# Patient Record
Sex: Female | Born: 1955 | Race: White | Hispanic: No | Marital: Married | Smoking: Never smoker
Health system: Southern US, Community
[De-identification: ages and names within clinical notes are randomized; demographics above are authoritative.]

## PROBLEM LIST (undated history)

## (undated) DIAGNOSIS — L0390 Cellulitis, unspecified: Secondary | ICD-10-CM

## (undated) DIAGNOSIS — Z853 Personal history of malignant neoplasm of breast: Secondary | ICD-10-CM

## (undated) DIAGNOSIS — N63 Unspecified lump in unspecified breast: Secondary | ICD-10-CM

## (undated) DIAGNOSIS — L0291 Cutaneous abscess, unspecified: Secondary | ICD-10-CM

## (undated) DIAGNOSIS — G40909 Epilepsy, unspecified, not intractable, without status epilepticus: Secondary | ICD-10-CM

## (undated) DIAGNOSIS — C50919 Malignant neoplasm of unspecified site of unspecified female breast: Secondary | ICD-10-CM

## (undated) DIAGNOSIS — C50419 Malignant neoplasm of upper-outer quadrant of unspecified female breast: Secondary | ICD-10-CM

## (undated) DIAGNOSIS — Z1239 Encounter for other screening for malignant neoplasm of breast: Secondary | ICD-10-CM

## (undated) DIAGNOSIS — Z1211 Encounter for screening for malignant neoplasm of colon: Secondary | ICD-10-CM

## (undated) DIAGNOSIS — T792XXA Traumatic secondary and recurrent hemorrhage and seroma, initial encounter: Secondary | ICD-10-CM

## (undated) HISTORY — DX: Cellulitis, unspecified: L03.90

## (undated) HISTORY — DX: Malignant neoplasm of upper-outer quadrant of unspecified female breast: C50.419

## (undated) HISTORY — PX: BREAST EXCISIONAL BIOPSY: SUR124

## (undated) HISTORY — PX: OTHER SURGICAL HISTORY: SHX169

## (undated) HISTORY — DX: Unspecified lump in unspecified breast: N63.0

## (undated) HISTORY — DX: Epilepsy, unspecified, not intractable, without status epilepticus: G40.909

## (undated) HISTORY — DX: Traumatic secondary and recurrent hemorrhage and seroma, initial encounter: T79.2XXA

## (undated) HISTORY — DX: Encounter for screening for malignant neoplasm of colon: Z12.11

## (undated) HISTORY — DX: Encounter for other screening for malignant neoplasm of breast: Z12.39

## (undated) HISTORY — PX: GANGLION CYST EXCISION: SHX1691

## (undated) HISTORY — PX: WISDOM TOOTH EXTRACTION: SHX21

## (undated) HISTORY — DX: Cutaneous abscess, unspecified: L02.91

## (undated) HISTORY — DX: Personal history of malignant neoplasm of breast: Z85.3

---
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
## 2003-01-30 HISTORY — PX: CHOLECYSTECTOMY: SHX55

## 2003-12-14 ENCOUNTER — Ambulatory Visit: Payer: Self-pay | Admitting: General Surgery

## 2004-01-11 ENCOUNTER — Ambulatory Visit: Payer: Self-pay | Admitting: Obstetrics and Gynecology

## 2005-02-08 ENCOUNTER — Ambulatory Visit: Payer: Self-pay | Admitting: Obstetrics and Gynecology

## 2006-02-28 ENCOUNTER — Ambulatory Visit: Payer: Self-pay | Admitting: Obstetrics and Gynecology

## 2007-04-08 ENCOUNTER — Ambulatory Visit: Payer: Self-pay | Admitting: Obstetrics and Gynecology

## 2008-04-13 ENCOUNTER — Ambulatory Visit: Payer: Self-pay | Admitting: Obstetrics and Gynecology

## 2008-04-26 ENCOUNTER — Ambulatory Visit: Payer: Self-pay | Admitting: Obstetrics and Gynecology

## 2008-06-01 ENCOUNTER — Ambulatory Visit: Payer: Self-pay | Admitting: Gastroenterology

## 2008-06-01 HISTORY — PX: COLONOSCOPY: SHX174

## 2009-04-14 ENCOUNTER — Ambulatory Visit: Payer: Self-pay | Admitting: Obstetrics and Gynecology

## 2010-01-29 DIAGNOSIS — Z923 Personal history of irradiation: Secondary | ICD-10-CM

## 2010-01-29 DIAGNOSIS — C50919 Malignant neoplasm of unspecified site of unspecified female breast: Secondary | ICD-10-CM

## 2010-01-29 HISTORY — DX: Personal history of irradiation: Z92.3

## 2010-01-29 HISTORY — PX: BREAST BIOPSY: SHX20

## 2010-01-29 HISTORY — PX: BREAST SURGERY: SHX581

## 2010-01-29 HISTORY — DX: Malignant neoplasm of unspecified site of unspecified female breast: C50.919

## 2010-01-29 HISTORY — PX: BREAST LUMPECTOMY: SHX2

## 2010-04-18 ENCOUNTER — Ambulatory Visit: Payer: Self-pay | Admitting: Obstetrics and Gynecology

## 2010-05-03 ENCOUNTER — Ambulatory Visit: Payer: Self-pay | Admitting: General Practice

## 2010-10-30 ENCOUNTER — Ambulatory Visit: Payer: Self-pay | Admitting: Internal Medicine

## 2010-11-08 ENCOUNTER — Ambulatory Visit: Payer: Self-pay | Admitting: General Surgery

## 2010-11-10 ENCOUNTER — Ambulatory Visit: Payer: Self-pay | Admitting: General Surgery

## 2010-11-21 ENCOUNTER — Ambulatory Visit: Payer: Self-pay | Admitting: General Surgery

## 2010-11-23 LAB — PATHOLOGY REPORT

## 2010-11-29 ENCOUNTER — Ambulatory Visit: Payer: Self-pay | Admitting: Internal Medicine

## 2010-11-30 ENCOUNTER — Ambulatory Visit: Payer: Self-pay | Admitting: Internal Medicine

## 2010-12-18 ENCOUNTER — Ambulatory Visit: Payer: Self-pay | Admitting: Internal Medicine

## 2010-12-30 ENCOUNTER — Ambulatory Visit: Payer: Self-pay | Admitting: Internal Medicine

## 2011-01-30 ENCOUNTER — Ambulatory Visit: Payer: Self-pay | Admitting: Internal Medicine

## 2011-01-31 LAB — CBC CANCER CENTER
Basophil #: 0.1 x10 3/mm (ref 0.0–0.1)
Eosinophil %: 8.5 %
HGB: 13.7 g/dL (ref 12.0–16.0)
Lymphocyte #: 0.9 x10 3/mm — ABNORMAL LOW (ref 1.0–3.6)
Lymphocyte %: 20.4 %
MCV: 90 fL (ref 80–100)
Monocyte #: 0.3 x10 3/mm (ref 0.0–0.7)
Monocyte %: 6.6 %
Neutrophil #: 2.8 x10 3/mm (ref 1.4–6.5)
Neutrophil %: 63.4 %
Platelet: 187 x10 3/mm (ref 150–440)
RBC: 4.36 10*6/uL (ref 3.80–5.20)
RDW: 13.6 % (ref 11.5–14.5)
WBC: 4.5 x10 3/mm (ref 3.6–11.0)

## 2011-02-07 LAB — CBC CANCER CENTER
Basophil #: 0 x10 3/mm (ref 0.0–0.1)
Eosinophil #: 0.2 x10 3/mm (ref 0.0–0.7)
Eosinophil %: 5.5 %
HGB: 13.3 g/dL (ref 12.0–16.0)
Lymphocyte #: 0.7 x10 3/mm — ABNORMAL LOW (ref 1.0–3.6)
MCH: 31.4 pg (ref 26.0–34.0)
MCHC: 35.1 g/dL (ref 32.0–36.0)
MCV: 90 fL (ref 80–100)
Monocyte #: 0.3 x10 3/mm (ref 0.0–0.7)
Neutrophil %: 60.7 %
Platelet: 152 x10 3/mm (ref 150–440)
RDW: 13.6 % (ref 11.5–14.5)
WBC: 3.1 x10 3/mm — ABNORMAL LOW (ref 3.6–11.0)

## 2011-02-14 LAB — CBC CANCER CENTER
Basophil #: 0 "x10 3/mm "
Basophil %: 0.5 %
Eosinophil #: 0.1 "x10 3/mm "
Eosinophil %: 3.1 %
HCT: 38.6 %
HGB: 13.3 g/dL
Lymphocyte %: 20.7 %
Lymphs Abs: 0.7 "x10 3/mm " — ABNORMAL LOW
MCH: 30.8 pg
MCHC: 34.3 g/dL
MCV: 90 fL
Monocyte #: 0.3 "x10 3/mm "
Monocyte %: 10.3 %
Neutrophil #: 2.1 "x10 3/mm "
Neutrophil %: 65.4 %
Platelet: 131 "x10 3/mm " — ABNORMAL LOW
RBC: 4.31 "x10 6/mm "
RDW: 13.1 %
WBC: 3.1 "x10 3/mm " — ABNORMAL LOW

## 2011-02-21 LAB — CBC CANCER CENTER
Basophil #: 0 x10 3/mm (ref 0.0–0.1)
Basophil %: 0.6 %
HCT: 38 % (ref 35.0–47.0)
Lymphocyte #: 0.4 x10 3/mm — ABNORMAL LOW (ref 1.0–3.6)
Lymphocyte %: 15.8 %
MCH: 31.5 pg (ref 26.0–34.0)
MCV: 91 fL (ref 80–100)
Monocyte #: 0.3 x10 3/mm (ref 0.0–0.7)
Neutrophil #: 2 x10 3/mm (ref 1.4–6.5)
Platelet: 137 x10 3/mm — ABNORMAL LOW (ref 150–440)
RBC: 4.21 10*6/uL (ref 3.80–5.20)
RDW: 13.1 % (ref 11.5–14.5)
WBC: 2.8 x10 3/mm — ABNORMAL LOW (ref 3.6–11.0)

## 2011-02-28 LAB — CBC CANCER CENTER
Basophil #: 0 x10 3/mm (ref 0.0–0.1)
Basophil %: 0.6 %
Eosinophil #: 0.1 x10 3/mm (ref 0.0–0.7)
Eosinophil %: 2.4 %
HCT: 38.6 % (ref 35.0–47.0)
Lymphocyte %: 17.6 %
MCHC: 34.9 g/dL (ref 32.0–36.0)
Monocyte #: 0.3 x10 3/mm (ref 0.0–0.7)
Monocyte %: 8.3 %
Neutrophil %: 71.1 %
RBC: 4.31 10*6/uL (ref 3.80–5.20)
RDW: 13.8 % (ref 11.5–14.5)
WBC: 3.3 x10 3/mm — ABNORMAL LOW (ref 3.6–11.0)

## 2011-03-02 ENCOUNTER — Ambulatory Visit: Payer: Self-pay | Admitting: Internal Medicine

## 2011-03-06 LAB — CBC CANCER CENTER
Basophil #: 0 x10 3/mm (ref 0.0–0.1)
Basophil %: 0.5 %
Eosinophil #: 0.1 x10 3/mm (ref 0.0–0.7)
Eosinophil %: 3.3 %
Lymphocyte %: 20.2 %
MCH: 31.6 pg (ref 26.0–34.0)
MCHC: 35.3 g/dL (ref 32.0–36.0)
Monocyte #: 0.2 x10 3/mm (ref 0.0–0.7)
Monocyte %: 8.7 %
Neutrophil %: 67.3 %
Platelet: 141 x10 3/mm — ABNORMAL LOW (ref 150–440)
RBC: 4.19 10*6/uL (ref 3.80–5.20)

## 2011-03-06 LAB — CREATININE, SERUM
Creatinine: 1 mg/dL (ref 0.60–1.30)
EGFR (African American): >60

## 2011-03-06 LAB — LIPID PANEL
Cholesterol: 207 mg/dL — ABNORMAL HIGH (ref 0–200)
HDL Cholesterol: 80 mg/dL — ABNORMAL HIGH (ref 40–60)
Ldl Cholesterol, Calc: 118 mg/dL — ABNORMAL HIGH (ref 0–100)

## 2011-03-06 LAB — HEPATIC FUNCTION PANEL A (ARMC)
Alkaline Phosphatase: 119 U/L (ref 50–136)
Bilirubin, Direct: 0.1 mg/dL (ref 0.00–0.20)
SGOT(AST): 49 U/L — ABNORMAL HIGH (ref 15–37)
SGPT (ALT): 63 U/L

## 2011-03-30 ENCOUNTER — Ambulatory Visit: Payer: Self-pay | Admitting: Internal Medicine

## 2011-04-30 ENCOUNTER — Ambulatory Visit: Payer: Self-pay | Admitting: Internal Medicine

## 2011-05-10 ENCOUNTER — Ambulatory Visit: Payer: Self-pay | Admitting: General Surgery

## 2011-05-24 ENCOUNTER — Other Ambulatory Visit: Payer: Self-pay | Admitting: General Surgery

## 2011-05-24 LAB — CBC WITH DIFFERENTIAL/PLATELET
Basophil %: 1.2 %
Eosinophil #: 0.1 10*3/uL (ref 0.0–0.7)
Eosinophil %: 2.8 %
HCT: 37.4 % (ref 35.0–47.0)
HGB: 12.8 g/dL (ref 12.0–16.0)
Lymphocyte #: 0.4 10*3/uL — ABNORMAL LOW (ref 1.0–3.6)
MCHC: 34.1 g/dL (ref 32.0–36.0)
Monocyte #: 0.2 x10 3/mm (ref 0.2–0.9)
Monocyte %: 9.9 %
Neutrophil #: 1.6 10*3/uL (ref 1.4–6.5)
Neutrophil %: 69.1 %
RBC: 4.12 10*6/uL (ref 3.80–5.20)
RDW: 12.7 % (ref 11.5–14.5)
WBC: 2.3 10*3/uL — ABNORMAL LOW (ref 3.6–11.0)

## 2011-07-04 ENCOUNTER — Ambulatory Visit: Payer: Self-pay | Admitting: Internal Medicine

## 2011-07-04 LAB — CBC CANCER CENTER
Basophil %: 1.1 %
Comment - H1-Com2: NORMAL
Eosinophil #: 0.1 x10 3/mm (ref 0.0–0.7)
Eosinophil %: 5.3 %
Eosinophil: 5 %
HGB: 13.7 g/dL (ref 12.0–16.0)
MCH: 30.5 pg (ref 26.0–34.0)
MCHC: 33.7 g/dL (ref 32.0–36.0)
Monocyte #: 0.3 x10 3/mm (ref 0.2–0.9)
Monocyte %: 9.5 %
Monocytes: 8 %
Platelet: 155 x10 3/mm (ref 150–440)
RDW: 12.7 % (ref 11.5–14.5)
Segmented Neutrophils: 68 %
WBC: 2.7 x10 3/mm — ABNORMAL LOW (ref 3.6–11.0)

## 2011-07-04 LAB — HEPATIC FUNCTION PANEL A (ARMC)
Albumin: 3.8 g/dL (ref 3.4–5.0)
Bilirubin, Direct: 0.1 mg/dL (ref 0.00–0.20)
SGOT(AST): 43 U/L — ABNORMAL HIGH (ref 15–37)
SGPT (ALT): 57 U/L
Total Protein: 7.1 g/dL (ref 6.4–8.2)

## 2011-07-04 LAB — CREATININE, SERUM
Creatinine: 0.87 mg/dL (ref 0.60–1.30)
EGFR (Non-African Amer.): >60

## 2011-07-04 LAB — FOLATE: Folic Acid: 47.2 ng/mL (ref 3.1–100.0)

## 2011-07-04 LAB — IRON AND TIBC
Iron Bind.Cap.(Total): 391 ug/dL (ref 250–450)
Iron Saturation: 22 %
Unbound Iron-Bind.Cap.: 305 ug/dL

## 2011-07-04 LAB — APTT: Activated PTT: 32.7 secs (ref 23.6–35.9)

## 2011-07-04 LAB — RETICULOCYTES: Reticulocyte: 1.1 % (ref 0.5–1.5)

## 2011-07-04 LAB — PROTIME-INR: INR: 0.9

## 2011-07-30 ENCOUNTER — Ambulatory Visit: Payer: Self-pay | Admitting: Internal Medicine

## 2011-09-05 ENCOUNTER — Ambulatory Visit: Payer: Self-pay | Admitting: Internal Medicine

## 2011-09-05 LAB — CREATININE, SERUM
Creatinine: 1.04 mg/dL (ref 0.60–1.30)
EGFR (African American): >60
EGFR (Non-African Amer.): >60

## 2011-09-05 LAB — CBC CANCER CENTER
Eosinophil #: 0.1 x10 3/mm (ref 0.0–0.7)
Eosinophil %: 3.5 %
Lymphocyte #: 0.5 x10 3/mm — ABNORMAL LOW (ref 1.0–3.6)
MCH: 31.3 pg (ref 26.0–34.0)
MCHC: 34.4 g/dL (ref 32.0–36.0)
MCV: 91 fL (ref 80–100)
Monocyte #: 0.2 x10 3/mm (ref 0.2–0.9)
Monocyte %: 8.3 %
Neutrophil #: 1.8 x10 3/mm (ref 1.4–6.5)
Neutrophil %: 69.3 %
Platelet: 141 x10 3/mm — ABNORMAL LOW (ref 150–440)

## 2011-09-05 LAB — HEPATIC FUNCTION PANEL A (ARMC)
Albumin: 3.6 g/dL (ref 3.4–5.0)
Alkaline Phosphatase: 134 U/L (ref 50–136)
Bilirubin, Direct: 0.1 mg/dL (ref 0.00–0.20)
Bilirubin,Total: 0.4 mg/dL (ref 0.2–1.0)
Total Protein: 6.8 g/dL (ref 6.4–8.2)

## 2011-09-30 ENCOUNTER — Ambulatory Visit: Payer: Self-pay | Admitting: Internal Medicine

## 2011-11-12 ENCOUNTER — Ambulatory Visit: Payer: Self-pay | Admitting: General Surgery

## 2011-11-28 ENCOUNTER — Ambulatory Visit: Payer: Self-pay | Admitting: General Practice

## 2012-01-02 ENCOUNTER — Ambulatory Visit: Payer: Self-pay | Admitting: Internal Medicine

## 2012-01-02 LAB — CBC CANCER CENTER
Bands: 3 %
Basophil #: 0 x10 3/mm (ref 0.0–0.1)
Eosinophil #: 0.1 x10 3/mm (ref 0.0–0.7)
HCT: 43.6 % (ref 35.0–47.0)
Lymphocyte #: 0.6 x10 3/mm — ABNORMAL LOW (ref 1.0–3.6)
Lymphocyte %: 20.5 %
MCHC: 34.6 g/dL (ref 32.0–36.0)
Monocytes: 8 %
Neutrophil #: 2 x10 3/mm (ref 1.4–6.5)
Platelet: 158 x10 3/mm (ref 150–440)
RDW: 13.1 % (ref 11.5–14.5)
Segmented Neutrophils: 65 %
WBC: 3 x10 3/mm — ABNORMAL LOW (ref 3.6–11.0)

## 2012-01-02 LAB — HEPATIC FUNCTION PANEL A (ARMC)
Albumin: 4 g/dL (ref 3.4–5.0)
Alkaline Phosphatase: 119 U/L (ref 50–136)
Bilirubin, Direct: 0.1 mg/dL (ref 0.00–0.20)
SGOT(AST): 39 U/L — ABNORMAL HIGH (ref 15–37)

## 2012-01-02 LAB — CREATININE, SERUM
EGFR (African American): >60
EGFR (Non-African Amer.): >60

## 2012-01-03 LAB — PROT IMMUNOELECTROPHORES(ARMC)

## 2012-01-30 ENCOUNTER — Ambulatory Visit: Payer: Self-pay | Admitting: Internal Medicine

## 2012-03-07 ENCOUNTER — Ambulatory Visit: Payer: Self-pay | Admitting: Internal Medicine

## 2012-03-29 ENCOUNTER — Ambulatory Visit: Payer: Self-pay | Admitting: Internal Medicine

## 2012-04-04 ENCOUNTER — Encounter: Payer: Self-pay | Admitting: General Surgery

## 2012-04-04 ENCOUNTER — Encounter: Payer: Self-pay | Admitting: *Deleted

## 2012-05-12 ENCOUNTER — Ambulatory Visit: Payer: Self-pay | Admitting: General Surgery

## 2012-05-14 ENCOUNTER — Encounter: Payer: Self-pay | Admitting: General Surgery

## 2012-05-19 ENCOUNTER — Encounter: Payer: Self-pay | Admitting: *Deleted

## 2012-05-21 ENCOUNTER — Ambulatory Visit (INDEPENDENT_AMBULATORY_CARE_PROVIDER_SITE_OTHER): Payer: BC Managed Care – PPO | Admitting: General Surgery

## 2012-05-21 ENCOUNTER — Encounter: Payer: Self-pay | Admitting: General Surgery

## 2012-05-21 ENCOUNTER — Other Ambulatory Visit: Payer: Self-pay | Admitting: *Deleted

## 2012-05-21 VITALS — BP 124/72 | HR 74 | Resp 12 | Ht 63.0 in | Wt 161.0 lb

## 2012-05-21 DIAGNOSIS — C50911 Malignant neoplasm of unspecified site of right female breast: Secondary | ICD-10-CM

## 2012-05-21 DIAGNOSIS — C50919 Malignant neoplasm of unspecified site of unspecified female breast: Secondary | ICD-10-CM

## 2012-05-21 DIAGNOSIS — Z853 Personal history of malignant neoplasm of breast: Secondary | ICD-10-CM

## 2012-05-21 NOTE — Patient Instructions (Addendum)
Continue self breast exams. Call office for any new breast issues or concerns. 

## 2012-05-21 NOTE — Progress Notes (Signed)
Patient to have a bilateral diagnostic mammogram follow up in 6 months. 

## 2012-05-21 NOTE — Progress Notes (Signed)
Patient ID: Ashley Lester, female   DOB: Aug 23, 1955, 57 y.o.   MRN: [id number]  Chief Complaint  Patient presents with  . Breast Cancer Long Term Follow Up    mammogram    HPI Ashley Lester is a 57 y.o. female.  Patein here today for follow up mammogram. No new breast issues.  She has a known history of right breast cancer Oct 2012 she had right breast wide excision.  No family history of breast cancer.  She is followed at [facility] and has an appointment in June with Dr. Sherrlyn Hock. HPI  Past Medical History  Diagnosis Date  . Cellulitis and abscess of unspecified site   . Epilepsy   . Seizure disorder 1964  . Personal history of malignant neoplasm of breast     treated with right breast wide excision with sentinel node biopsy, mastoplasty, and radiation therapy  . Post-traumatic seroma   . Breast screening, unspecified   . Lump or mass in breast   . Special screening for malignant neoplasms, colon   . Malignant neoplasm of upper-outer quadrant of female breast     diagnosed on 11-22-10; right breast cancer    Past Surgical History  Procedure Laterality Date  . Ganglion cyst excision  1980's  . Cholecystectomy  2005  . Colonoscopy  2010  . Wisdom tooth extraction  1980's  . Breast surgery Right 2012    right breast wide excision and sentinel node biopsy completed on 11-21-10 identified a 1.6 cm histologic grade 2, ER/PR positive, HER-2/neu not overexpressing carcinoma with clear margins. Sentinel nodes x2 were negative for metastatic disease. Low Oncotype DX at 7%.    Family History  Problem Relation Age of Onset  . Leukemia Sister 34    Social History History  Substance Use Topics  . Smoking status: Never Smoker   . Smokeless tobacco: Not on file  . Alcohol Use: No    No Known Allergies  Current Outpatient Prescriptions  Medication Sig Dispense Refill  . anastrozole (ARIMIDEX) 1 MG tablet Take 1 mg by mouth daily.      Marland Kitchen CALCIUM ACETATE, PHOS BINDER,  PO Take by mouth.      . Inulin-Calcium-Vitamin D (FIBERCHOICE PLUS CALCIUM PO) Take by mouth.      . Omega-3 Fatty Acids (FISH OIL) 1000 MG CAPS Take by mouth daily.       No current facility-administered medications for this visit.    Review of Systems Review of Systems  Constitutional: Negative.   Respiratory: Negative.   Cardiovascular: Negative.     Blood pressure 124/72, pulse 74, resp. rate 12, height 5\' 3"  (1.6 m), weight 161 lb (73.029 kg).  Physical Exam Physical Exam  Constitutional: She is oriented to person, place, and time. She appears well-developed and well-nourished.  Cardiovascular: Normal rate and regular rhythm.   Pulmonary/Chest: Effort normal and breath sounds normal. Right breast exhibits no inverted nipple, no mass, no nipple discharge and no skin change. Left breast exhibits no inverted nipple, no mass, no nipple discharge, no skin change and no tenderness.  Right breast > left breast 1/2 cup size  Lymphadenopathy:    She has no cervical adenopathy.    She has no axillary adenopathy.  Neurological: She is alert and oriented to person, place, and time.  Skin: Skin is warm and dry.    Data Reviewed  PROCEDURE: MAM - MAM DGTL UNI MAM RT BREAST W/CAD  - May 12 2012  9:38AM   RESULT: There is  no definite family history of breast cancer. There is a  questionably history and maternal aunt. The patient has undergone  previous right breast lumpectomy positive for malignancy in 2012 with  radiation therapy. Unilateral right breast digital images are compared to  previous right breast images from 12 November 2011 as well as 10 May 2011 and 22 December 2002. There is a scar marker projecting above the  level of the nipple over the mid to posterior lateral right breast. There  is an extremely dense parenchymal pattern which is decreased over time.  The a there is no developing density, dominant mass or malignant  calcification. There is no significant  architectural distortion. The area   of density seen on previous images over the posterior lateral right  breast is not evident consistent with previous surgery.  IMPRESSION:  Stable, benign appearing unilateral right breast mammogram  with postoperative changes. Please continue to encourage annual  mammographic followup and monthly breast self exam. BREAST COMPOSITION:  The breast composition is EXTREMELY DENSE (glandular tissue greater than  75%) This decreases the sensitivity of mammography.  BI-RADS: Category 2-  Benign Finding     Assessment    Doing well now 18 months status post management of her T1c, N0 right breast carcinoma.     Plan    Arrangements will be made for bilateral mammograms in 6 months with an office visit to follow.        Earline Mayotte 05/21/2012, 9:49 PM

## 2012-06-29 ENCOUNTER — Ambulatory Visit: Payer: Self-pay | Admitting: Internal Medicine

## 2012-07-02 LAB — CBC CANCER CENTER
Basophil #: 0 x10 3/mm (ref 0.0–0.1)
Basophil %: 1.1 %
Eosinophil #: 0.1 x10 3/mm (ref 0.0–0.7)
Eosinophil %: 4.6 %
HCT: 40.8 % (ref 35.0–47.0)
HGB: 14.5 g/dL (ref 12.0–16.0)
Lymphocyte #: 0.6 x10 3/mm — ABNORMAL LOW (ref 1.0–3.6)
Lymphocyte %: 21.5 %
MCH: 31 pg (ref 26.0–34.0)
MCHC: 35.5 g/dL (ref 32.0–36.0)
MCV: 88 fL (ref 80–100)
Monocyte #: 0.2 x10 3/mm (ref 0.2–0.9)
Monocyte %: 7.6 %
Neutrophil #: 1.8 x10 3/mm (ref 1.4–6.5)
Neutrophil %: 65.2 %
Platelet: 163 x10 3/mm (ref 150–440)
RBC: 4.66 10*6/uL (ref 3.80–5.20)
RDW: 13.3 % (ref 11.5–14.5)
WBC: 2.8 x10 3/mm — ABNORMAL LOW (ref 3.6–11.0)

## 2012-07-02 LAB — HEPATIC FUNCTION PANEL A (ARMC)
Albumin: 3.8 g/dL (ref 3.4–5.0)
Bilirubin,Total: 0.5 mg/dL (ref 0.2–1.0)
SGPT (ALT): 112 U/L — ABNORMAL HIGH (ref 12–78)
Total Protein: 7 g/dL (ref 6.4–8.2)

## 2012-07-29 ENCOUNTER — Ambulatory Visit: Payer: Self-pay | Admitting: Internal Medicine

## 2012-08-27 LAB — CBC CANCER CENTER
HCT: 41.1 % (ref 35.0–47.0)
Lymphocyte #: 0.7 x10 3/mm — ABNORMAL LOW (ref 1.0–3.6)
MCH: 31.7 pg (ref 26.0–34.0)
Monocyte #: 0.3 x10 3/mm (ref 0.2–0.9)
Monocyte %: 8.3 %
Neutrophil #: 2.4 x10 3/mm (ref 1.4–6.5)
Neutrophil %: 68.4 %
Platelet: 183 x10 3/mm (ref 150–440)
RBC: 4.61 10*6/uL (ref 3.80–5.20)
RDW: 12.9 % (ref 11.5–14.5)
WBC: 3.5 x10 3/mm — ABNORMAL LOW (ref 3.6–11.0)

## 2012-08-27 LAB — HEPATIC FUNCTION PANEL A (ARMC)
Bilirubin, Direct: 0.1 mg/dL (ref 0.00–0.20)
SGOT(AST): 49 U/L — ABNORMAL HIGH (ref 15–37)
Total Protein: 7.2 g/dL (ref 6.4–8.2)

## 2012-08-27 LAB — CREATININE, SERUM: EGFR (African American): >60

## 2012-08-29 ENCOUNTER — Ambulatory Visit: Payer: Self-pay | Admitting: Internal Medicine

## 2012-08-29 ENCOUNTER — Ambulatory Visit: Payer: Self-pay | Admitting: General Practice

## 2012-09-29 ENCOUNTER — Ambulatory Visit: Payer: Self-pay | Admitting: Internal Medicine

## 2012-11-12 ENCOUNTER — Encounter: Payer: Self-pay | Admitting: General Surgery

## 2012-11-12 ENCOUNTER — Ambulatory Visit: Payer: Self-pay | Admitting: General Surgery

## 2012-11-25 ENCOUNTER — Ambulatory Visit: Payer: BC Managed Care – PPO | Admitting: General Surgery

## 2012-12-03 ENCOUNTER — Encounter: Payer: Self-pay | Admitting: General Surgery

## 2012-12-03 ENCOUNTER — Ambulatory Visit (INDEPENDENT_AMBULATORY_CARE_PROVIDER_SITE_OTHER): Payer: BC Managed Care – PPO | Admitting: General Surgery

## 2012-12-03 VITALS — BP 112/72 | HR 74 | Resp 12 | Ht 63.0 in | Wt 167.0 lb

## 2012-12-03 DIAGNOSIS — Z853 Personal history of malignant neoplasm of breast: Secondary | ICD-10-CM

## 2012-12-03 NOTE — Patient Instructions (Addendum)
Patient to return in one year bilateral diagnotic mammogram.  

## 2012-12-03 NOTE — Progress Notes (Signed)
Patient ID: Ashley Lester, female   DOB: 10-24-1955, 57 y.o.   MRN: [id number]  Chief Complaint  Patient presents with  . Follow-up    mammogram    HPI Ashley Lester is a 57 y.o. female  here today for follow up mammogram done on 11/12/12 . No new breast issues. She has a known history of right breast cancer Oct 2012 she had right breast wide excision. No family history of breast cancer  HPI  Past Medical History  Diagnosis Date  . Cellulitis and abscess of unspecified site   . Epilepsy   . Seizure disorder 1964  . Personal history of malignant neoplasm of breast     treated with right breast wide excision with sentinel node biopsy, mastoplasty, and radiation therapy  . Post-traumatic seroma   . Breast screening, unspecified   . Lump or mass in breast   . Special screening for malignant neoplasms, colon   . Malignant neoplasm of upper-outer quadrant of female breast     diagnosed on 11-22-10; right breast cancer, invasive mammary cancer, 1.6 cm, Sentinel lymph node negative, ER/PR positive, HER-2/neu is not amplified, Oncotype Dx assay recurrent score 11 (7% chance of distant recurrence at 10 years)    Past Surgical History  Procedure Laterality Date  . Ganglion cyst excision  1980's  . Cholecystectomy  2005  . Colonoscopy  2010  . Wisdom tooth extraction  1980's  . Breast surgery Right 2012    right breast wide excision and sentinel node biopsy completed on 11-21-10 identified a 1.6 cm histologic grade 2, ER/PR positive, HER-2/neu not overexpressing carcinoma with clear margins. Sentinel nodes x2 were negative for metastatic disease. Low Oncotype DX at 7%.    Family History  Problem Relation Age of Onset  . Leukemia Sister 36    Social History History  Substance Use Topics  . Smoking status: Never Smoker   . Smokeless tobacco: Not on file  . Alcohol Use: No    No Known Allergies  Current Outpatient Prescriptions  Medication Sig Dispense Refill  .  anastrozole (ARIMIDEX) 1 MG tablet Take 1 mg by mouth daily.      Marland Kitchen CALCIUM ACETATE, PHOS BINDER, PO Take by mouth.      . Inulin-Calcium-Vitamin D (FIBERCHOICE PLUS CALCIUM PO) Take by mouth.      . Omega-3 Fatty Acids (FISH OIL) 1000 MG CAPS Take by mouth daily.       No current facility-administered medications for this visit.    Review of Systems Review of Systems  Constitutional: Negative.   Respiratory: Negative.   Cardiovascular: Negative.     Blood pressure 112/72, pulse 74, resp. rate 12, height 5\' 3"  (1.6 m), weight 167 lb (75.751 kg).  Physical Exam Physical Exam  Constitutional: She is oriented to person, place, and time. She appears well-developed and well-nourished.  Eyes: No scleral icterus.  Cardiovascular: Normal rate, regular rhythm and normal heart sounds.   Pulmonary/Chest: Breath sounds normal. Right breast exhibits no inverted nipple, no mass, no nipple discharge, no skin change and no tenderness. Left breast exhibits no inverted nipple, no mass, no nipple discharge, no skin change and no tenderness.  Well healed Scar right breat upper outer quadrant.   Abdominal: Bowel sounds are normal.  Lymphadenopathy:    She has no cervical adenopathy.    She has no axillary adenopathy.  Neurological: She is alert and oriented to person, place, and time.  Skin: Skin is warm and dry.    Data  Reviewed Bilateral mammogram dated November 12, 2012 were reviewed. Postbiopsy changes noted. BI-RAD-2.  Assessment    Doing well now 2 years status post treatment of her right breast cancer.  Unrepentent [place] [employer].     Plan    Bilateral diagnostic mammograms and office visit in one year.        Earline Mayotte 12/03/2012, 8:02 PM

## 2012-12-04 ENCOUNTER — Other Ambulatory Visit: Payer: Self-pay

## 2012-12-15 ENCOUNTER — Encounter: Payer: Self-pay | Admitting: General Surgery

## 2013-01-09 ENCOUNTER — Ambulatory Visit: Payer: Self-pay | Admitting: Internal Medicine

## 2013-01-09 LAB — CBC CANCER CENTER
Basophil %: 1.1 %
HCT: 40.7 % (ref 35.0–47.0)
Lymphocyte %: 24.8 %
MCH: 30.4 pg (ref 26.0–34.0)
MCHC: 33.6 g/dL (ref 32.0–36.0)
MCV: 90 fL (ref 80–100)
Monocyte #: 0.3 x10 3/mm (ref 0.2–0.9)
Monocyte %: 8.6 %
Neutrophil #: 2.1 x10 3/mm (ref 1.4–6.5)
Platelet: 156 x10 3/mm (ref 150–440)
RBC: 4.51 10*6/uL (ref 3.80–5.20)
WBC: 3.4 x10 3/mm — ABNORMAL LOW (ref 3.6–11.0)

## 2013-01-09 LAB — HEPATIC FUNCTION PANEL A (ARMC)
Albumin: 3.5 g/dL (ref 3.4–5.0)
Alkaline Phosphatase: 138 U/L — ABNORMAL HIGH
Bilirubin, Direct: 0.1 mg/dL (ref 0.00–0.20)
Bilirubin,Total: 0.3 mg/dL (ref 0.2–1.0)

## 2013-01-09 LAB — CREATININE, SERUM: EGFR (African American): >60

## 2013-01-27 ENCOUNTER — Ambulatory Visit: Payer: Self-pay | Admitting: Internal Medicine

## 2013-01-29 ENCOUNTER — Ambulatory Visit: Payer: Self-pay | Admitting: Internal Medicine

## 2013-06-10 ENCOUNTER — Ambulatory Visit: Payer: Self-pay | Admitting: Internal Medicine

## 2013-06-10 LAB — HEPATIC FUNCTION PANEL A (ARMC)
ALBUMIN: 3.9 g/dL (ref 3.4–5.0)
ALK PHOS: 125 U/L — AB
AST: 60 U/L — AB (ref 15–37)
BILIRUBIN DIRECT: 0.1 mg/dL (ref 0.00–0.20)
Bilirubin,Total: 0.4 mg/dL (ref 0.2–1.0)
SGPT (ALT): 85 U/L — ABNORMAL HIGH (ref 12–78)
TOTAL PROTEIN: 7.1 g/dL (ref 6.4–8.2)

## 2013-06-10 LAB — CBC CANCER CENTER
Basophil #: 0 x10 3/mm (ref 0.0–0.1)
Basophil %: 1 %
Eosinophil #: 0.1 x10 3/mm (ref 0.0–0.7)
Eosinophil %: 1.7 %
HCT: 40.7 % (ref 35.0–47.0)
HGB: 14 g/dL (ref 12.0–16.0)
LYMPHS PCT: 23.7 %
Lymphocyte #: 0.8 x10 3/mm — ABNORMAL LOW (ref 1.0–3.6)
MCH: 30.6 pg (ref 26.0–34.0)
MCHC: 34.3 g/dL (ref 32.0–36.0)
MCV: 89 fL (ref 80–100)
MONO ABS: 0.3 x10 3/mm (ref 0.2–0.9)
MONOS PCT: 7.6 %
NEUTROS ABS: 2.2 x10 3/mm (ref 1.4–6.5)
Neutrophil %: 66 %
Platelet: 165 x10 3/mm (ref 150–440)
RBC: 4.56 10*6/uL (ref 3.80–5.20)
RDW: 13.2 % (ref 11.5–14.5)
WBC: 3.3 x10 3/mm — ABNORMAL LOW (ref 3.6–11.0)

## 2013-06-10 LAB — CREATININE, SERUM
Creatinine: 0.94 mg/dL (ref 0.60–1.30)
EGFR (Non-African Amer.): >60

## 2013-06-29 ENCOUNTER — Ambulatory Visit: Payer: Self-pay | Admitting: Internal Medicine

## 2013-09-15 ENCOUNTER — Ambulatory Visit: Payer: Self-pay | Admitting: Radiation Oncology

## 2013-09-29 ENCOUNTER — Ambulatory Visit: Payer: Self-pay | Admitting: Radiation Oncology

## 2013-11-18 ENCOUNTER — Ambulatory Visit: Payer: Self-pay | Admitting: General Surgery

## 2013-11-18 ENCOUNTER — Encounter: Payer: Self-pay | Admitting: General Surgery

## 2013-11-30 ENCOUNTER — Encounter: Payer: Self-pay | Admitting: General Surgery

## 2013-12-02 ENCOUNTER — Encounter: Payer: Self-pay | Admitting: General Surgery

## 2013-12-02 ENCOUNTER — Ambulatory Visit (INDEPENDENT_AMBULATORY_CARE_PROVIDER_SITE_OTHER): Payer: BC Managed Care – PPO | Admitting: General Surgery

## 2013-12-02 VITALS — BP 100/70 | HR 68 | Resp 12 | Ht 63.0 in | Wt 152.0 lb

## 2013-12-02 DIAGNOSIS — Z853 Personal history of malignant neoplasm of breast: Secondary | ICD-10-CM

## 2013-12-02 NOTE — Patient Instructions (Signed)
The patient has been asked to return to the office in one year with a bilateral diagnostic mammogram.Continue self breast exams. Call office for any new breast issues or concerns.  

## 2013-12-02 NOTE — Progress Notes (Signed)
Patient ID: Ashley Lester, female   DOB: 07/13/1955, 58 y.o.   MRN: [id number]  Chief Complaint  Patient presents with  . Follow-up    mammogram    HPI Ashley Lester is a 58 y.o. female. who presents for a breast evaluation. The most recent mammogram was done on 11/17/13. Patient does perform regular self breast checks and gets regular mammograms done.  No new complaints at this time.    HPI  Past Medical History  Diagnosis Date  . Cellulitis and abscess of unspecified site   . Epilepsy   . Seizure disorder 1964  . Personal history of malignant neoplasm of breast     treated with right breast wide excision with sentinel node biopsy, mastoplasty, and radiation therapy  . Post-traumatic seroma   . Breast screening, unspecified   . Lump or mass in breast   . Special screening for malignant neoplasms, colon   . Malignant neoplasm of upper-outer quadrant of female breast     diagnosed on 11-22-10; right breast cancer, invasive mammary cancer, 1.6 cm, Sentinel lymph node negative, ER/PR positive, HER-2/neu is not amplified, Oncotype Dx assay recurrent score 11 (7% chance of distant recurrence at 10 years)    Past Surgical History  Procedure Laterality Date  . Ganglion cyst excision  1980's  . Cholecystectomy  2005  . Colonoscopy  2010  . Wisdom tooth extraction  1980's  . Breast surgery Right 2012    right breast wide excision and sentinel node biopsy completed on 11-21-10 identified a 1.6 cm histologic grade 2, ER/PR positive, HER-2/neu not overexpressing carcinoma with clear margins. Sentinel nodes x2 were negative for metastatic disease. Low Oncotype DX at 7%.    Family History  Problem Relation Age of Onset  . Leukemia Sister 70    Social History History  Substance Use Topics  . Smoking status: Never Smoker   . Smokeless tobacco: Not on file  . Alcohol Use: No    No Known Allergies  Current Outpatient Prescriptions  Medication Sig Dispense Refill  .  anastrozole (ARIMIDEX) 1 MG tablet Take 1 mg by mouth daily.    Marland Kitchen CALCIUM ACETATE, PHOS BINDER, PO Take by mouth.    . Inulin-Calcium-Vitamin D (FIBERCHOICE PLUS CALCIUM PO) Take by mouth.    . Nutritional Supplements (JUICE PLUS FIBRE PO) Take by mouth.    . Omega-3 Fatty Acids (FISH OIL) 1000 MG CAPS Take by mouth daily.     No current facility-administered medications for this visit.    Review of Systems Review of Systems  Constitutional: Negative.   Respiratory: Negative.   Cardiovascular: Negative.     Blood pressure 100/70, pulse 68, resp. rate 12, height '5\' 3"'  (1.6 m), weight 152 lb (68.947 kg).  Physical Exam Physical Exam  Constitutional: She is oriented to person, place, and time. She appears well-developed and well-nourished.  Neck: Neck supple. No thyromegaly present.  Cardiovascular: Normal rate, regular rhythm and normal heart sounds.   No murmur heard. Pulmonary/Chest: Effort normal and breath sounds normal. Right breast exhibits no inverted nipple, no mass, no nipple discharge, no skin change and no tenderness. Left breast exhibits no inverted nipple, no mass, no nipple discharge, no skin change and no tenderness.  Right breast well healed incision in the upper outer quadrant.   Lymphadenopathy:    She has no cervical adenopathy.    She has no axillary adenopathy.  Neurological: She is alert and oriented to person, place, and time.  Skin: Skin  is warm and dry.    Data Reviewed Bilateral mammogram dated 11/18/2013 was reviewed and compared to previous studies. No interval change. BI-RADS-2.  Assessment    Doing well now 3 years status post conservative management of a right breast cancer. Tolerating aromatase inhibitor.     Plan    Arrangements will be made for follow-up examination in one year. Bilateral diagnostic mammograms will be obtained prior to that visit.     PCP:  Almetta Lovely 12/04/2013, 8:01 AM

## 2013-12-29 ENCOUNTER — Ambulatory Visit: Payer: BC Managed Care – PPO | Admitting: General Surgery

## 2014-07-20 ENCOUNTER — Other Ambulatory Visit: Payer: Self-pay

## 2014-07-20 DIAGNOSIS — C50911 Malignant neoplasm of unspecified site of right female breast: Secondary | ICD-10-CM

## 2014-07-21 ENCOUNTER — Inpatient Hospital Stay: Payer: BLUE CROSS/BLUE SHIELD | Attending: Internal Medicine | Admitting: Internal Medicine

## 2014-07-21 ENCOUNTER — Inpatient Hospital Stay: Payer: BLUE CROSS/BLUE SHIELD

## 2014-07-21 VITALS — BP 113/74 | HR 65 | Temp 98.7°F | Resp 17 | Ht 64.0 in | Wt 158.3 lb

## 2014-07-21 DIAGNOSIS — G40909 Epilepsy, unspecified, not intractable, without status epilepticus: Secondary | ICD-10-CM | POA: Diagnosis not present

## 2014-07-21 DIAGNOSIS — Z79811 Long term (current) use of aromatase inhibitors: Secondary | ICD-10-CM | POA: Insufficient documentation

## 2014-07-21 DIAGNOSIS — Z806 Family history of leukemia: Secondary | ICD-10-CM | POA: Diagnosis not present

## 2014-07-21 DIAGNOSIS — C50411 Malignant neoplasm of upper-outer quadrant of right female breast: Secondary | ICD-10-CM | POA: Insufficient documentation

## 2014-07-21 DIAGNOSIS — D72819 Decreased white blood cell count, unspecified: Secondary | ICD-10-CM | POA: Insufficient documentation

## 2014-07-21 DIAGNOSIS — Z79899 Other long term (current) drug therapy: Secondary | ICD-10-CM | POA: Diagnosis not present

## 2014-07-21 DIAGNOSIS — C50911 Malignant neoplasm of unspecified site of right female breast: Secondary | ICD-10-CM

## 2014-07-21 DIAGNOSIS — R740 Nonspecific elevation of levels of transaminase and lactic acid dehydrogenase [LDH]: Secondary | ICD-10-CM

## 2014-07-21 DIAGNOSIS — Z170 Estrogen receptor positive status [ER+]: Secondary | ICD-10-CM | POA: Insufficient documentation

## 2014-07-21 DIAGNOSIS — Z9049 Acquired absence of other specified parts of digestive tract: Secondary | ICD-10-CM | POA: Insufficient documentation

## 2014-07-21 LAB — HEPATIC FUNCTION PANEL
ALK PHOS: 91 U/L (ref 38–126)
ALT: 44 U/L (ref 14–54)
AST: 44 U/L — AB (ref 15–41)
Albumin: 4.1 g/dL (ref 3.5–5.0)
BILIRUBIN TOTAL: 0.6 mg/dL (ref 0.3–1.2)
Bilirubin, Direct: <0.1 mg/dL — ABNORMAL LOW (ref 0.1–0.5)
Total Protein: 6.6 g/dL (ref 6.5–8.1)

## 2014-07-21 LAB — CBC WITH DIFFERENTIAL/PLATELET
Basophils Absolute: 0 10*3/uL (ref 0–0.1)
Basophils Relative: 1 %
EOS PCT: 4 %
Eosinophils Absolute: 0.1 10*3/uL (ref 0–0.7)
HCT: 40.2 % (ref 35.0–47.0)
Hemoglobin: 13.5 g/dL (ref 12.0–16.0)
LYMPHS ABS: 1.1 10*3/uL (ref 1.0–3.6)
Lymphocytes Relative: 30 %
MCH: 29.9 pg (ref 26.0–34.0)
MCHC: 33.5 g/dL (ref 32.0–36.0)
MCV: 89.4 fL (ref 80.0–100.0)
Monocytes Absolute: 0.3 10*3/uL (ref 0.2–0.9)
Monocytes Relative: 6 %
Neutro Abs: 2.3 10*3/uL (ref 1.4–6.5)
Neutrophils Relative %: 59 %
Platelets: 170 10*3/uL (ref 150–440)
RBC: 4.5 MIL/uL (ref 3.80–5.20)
RDW: 13.5 % (ref 11.5–14.5)
WBC: 3.9 10*3/uL (ref 3.6–11.0)

## 2014-07-21 LAB — CREATININE, SERUM
CREATININE: 0.9 mg/dL (ref 0.44–1.00)
GFR calc Af Amer: >60 mL/min (ref >60)

## 2014-07-22 ENCOUNTER — Other Ambulatory Visit: Payer: Self-pay

## 2014-07-22 ENCOUNTER — Ambulatory Visit: Payer: Self-pay | Admitting: Internal Medicine

## 2014-07-28 ENCOUNTER — Other Ambulatory Visit: Payer: Self-pay

## 2014-07-28 ENCOUNTER — Ambulatory Visit: Payer: Self-pay | Admitting: Internal Medicine

## 2014-08-02 NOTE — Progress Notes (Signed)
Ashley Lester  Telephone:(336) [phone] Fax:(336) [phone]     ID: Jairo Ben OB: August 03, 1955  MR#: [id number]  PRF#:[id number]  Patient Care Team: Albina Billet, MD as PCP - General (Unknown Physician Specialty) Robert Bellow, MD (General Surgery)  CHIEF COMPLAINT/DIAGNOSIS:  pT1c pN0 (sn) cM0 (stage I) grade 2 invasive mammary carcinoma of the right breast s/p lumpectomy and sentinel node study on 11/21/2010. Tumor size 1.6 cm, grade 2 histology, margins negative.  2 sentinel lymph nodes negative for metastasis. ER and PR positive (80%). HER-2/neu negative by FISH (HER-2/CEP17 ratio 1.08).  ONCOTYPE Dx test 12/08/10 shows Breast cancer Recurrence score of 11 (low risk) with average rate of distant recurrence of 7%.  Started aromatase inhibitor early February 2013.     HISTORY OF PRESENT ILLNESS:  Patient returns for oncology followup for history of breast cancer, she was seen in May 2015. States she is doing steady, appetite is good, weight is steady. No fevers, chills or recurrent infections. She continues to take anastrozole and denies any new side effects from this. Denies feeling any new breast masses on self-exam. No new mood disturbances.  No new bone pains or other pain issues, 0/10.  REVIEW OF SYSTEMS:   ROS As in HPI above. In addition, no fevers or sweats. No new headaches or focal weakness.  No new mood disturbances. No  sore throat, cough, shortness of breath, sputum, hemoptysis or chest pain. No dizziness or palpitation. No abdominal pain, constipation, diarrhea, dysuria or hematuria. No new skin rash or bleeding symptoms. No new paresthesias in extremities. PS ECOG 0.  PAST MEDICAL HISTORY: Reviewed. Past Medical History  Diagnosis Date  . Cellulitis and abscess of unspecified site   . Epilepsy   . Seizure disorder 1964  . Personal history of malignant neoplasm of breast     treated with right breast wide excision with sentinel node biopsy,  mastoplasty, and radiation therapy  . Post-traumatic seroma   . Breast screening, unspecified   . Lump or mass in breast   . Special screening for malignant neoplasms, colon   . Malignant neoplasm of upper-outer quadrant of female breast     diagnosed on 11-22-10; right breast cancer, invasive mammary cancer, 1.6 cm, Sentinel lymph node negative, ER/PR positive, HER-2/neu is not amplified, Oncotype Dx assay recurrent score 11 (7% chance of distant recurrence at 10 years)          Cholecystectomy  Lumpectomy and sentinel node study on 11/21/2010 for stage I invasive mammary carcinoma of the right breast (Tumor size 1.6 cm, grade 2 histology, margins negative.  2 sentinel lymph nodes negative for metastasis. ER and PR positive. HER-2/neu negative)    Family History -  Denies breast, colon or ovarian malignancies    Social History -  Denies smoking, alcohol or recreational drug usage.  PAST SURGICAL HISTORY: Reviewed. Past Surgical History  Procedure Laterality Date  . Ganglion cyst excision  1980's  . Cholecystectomy  2005  . Colonoscopy  2010  . Wisdom tooth extraction  1980's  . Breast surgery Right 2012    right breast wide excision and sentinel node biopsy completed on 11-21-10 identified a 1.6 cm histologic grade 2, ER/PR positive, HER-2/neu not overexpressing carcinoma with clear margins. Sentinel nodes x2 were negative for metastatic disease. Low Oncotype DX at 7%.    FAMILY HISTORY: Reviewed. Family History  Problem Relation Age of Onset  . Leukemia Sister 33    ADVANCED DIRECTIVES:  <no information>  SOCIAL HISTORY: Reviewed. History  Substance Use Topics  . Smoking status: Never Smoker   . Smokeless tobacco: Not on file  . Alcohol Use: No    No Known Allergies  Current Outpatient Prescriptions  Medication Sig Dispense Refill  . anastrozole (ARIMIDEX) 1 MG tablet Take 1 mg by mouth daily.    . Inulin-Calcium-Vitamin D (FIBERCHOICE PLUS CALCIUM PO) Take by  mouth.    . Nutritional Supplements (JUICE PLUS FIBRE PO) Take by mouth.    . Omega-3 Fatty Acids (FISH OIL) 1000 MG CAPS Take by mouth daily.    Marland Kitchen PREVIDENT 5000 SENSITIVE 1.1-5 % PSTE USE AS DIRECTED 1 TIME DAILY  3   No current facility-administered medications for this visit.    PHYSICAL EXAM: Filed Vitals:   07/21/14 1450  BP: 113/74  Pulse: 65  Temp: 98.7 F (37.1 C)  Resp: 17     Body mass index is 27.16 kg/(m^2).    ECOG FS:0 - Asymptomatic  GENERAL: Patient is alert and oriented and in no acute distress. There is no icterus. HEENT: EOMs intact. No cervical lymphadenopathy. CVS: S1S2, regular LUNGS: Bilaterally clear to auscultation, no rhonchi. ABDOMEN: Soft, nontender. No hepatomegaly clinically.  EXTREMITIES: No pedal edema. BREASTS: chronic scar tissue palpable in right breast, otherwise no dominant breast masses. No masses palpable in left breast. No axillary adenopathy. Exam performed in presence of a nurse.   LAB RESULTS:    Component Value Date/Time   CREATININE 0.90 07/21/2014 1401   CREATININE 0.94 06/10/2013 1010   PROT 6.6 07/21/2014 1401   PROT 7.1 06/10/2013 1010   ALBUMIN 4.1 07/21/2014 1401   ALBUMIN 3.9 06/10/2013 1010   AST 44* 07/21/2014 1401   AST 60* 06/10/2013 1010   ALT 44 07/21/2014 1401   ALT 85* 06/10/2013 1010   ALKPHOS 91 07/21/2014 1401   ALKPHOS 125* 06/10/2013 1010   BILITOT 0.6 07/21/2014 1401   GFRNONAA >60 07/21/2014 1401   GFRNONAA >60 06/10/2013 1010   GFRAA >60 07/21/2014 1401   GFRAA >60 06/10/2013 1010   Lab Results  Component Value Date   WBC 3.9 07/21/2014   NEUTROABS 2.3 07/21/2014   HGB 13.5 07/21/2014   HCT 40.2 07/21/2014   MCV 89.4 07/21/2014   PLT 170 07/21/2014     STUDIES: Oct 2015. Mammogram. IMPRESSION: No mammogram evidence of malignancy.  Dec 2014 - DEXA scan reported unremarkable, T-score -0.8.Marland Kitchen   ASSESSMENT / PLAN:   1.  pT1c pN0 (sn) cM0 (stage I) grade 2 invasive mammary carcinoma of the  right breast s/p lumpectomy and sentinel node study on 11/21/2010. Tumor size 1.6 cm, grade 2 histology, margins negative.  2 sentinel lymph nodes negative for metastasis. ER and PR positive (80%). HER-2/neu negative by FISH (HER-2/CEP17 ratio 1.08).  ONCOTYPE Dx test 12/08/10 showed Breast cancer Recurrence score of 11 (low risk) with average rate of distant recurrence of 7% -  have reviewed labs from today and d/w patient. She is doing well without any symptoms to suggest recurrent/metastatic breast cancer. Mammogram in Oct 2015 was negative. Plan is to continue on hormonal therapy with anastrozole 1 mg daily, she is tolerating this well without major side effects. She was reminded to continue on calcium + vit D supplement. States she sees Dr.Byrnett after mammogram in October, will therefore see her back in one year with repeat labs.   2. Elevated transaminases of unclear etiology - Ultrasound of abdomen on 07/07/12 did not report any obvious liver abnormalities; normal limited  upper abdominal ultrasound. The patient is also seeing Gastroenterology. Repeat LFT today shows minimally abnormal alkaline phosphatase and transaminases. Will repeat LFT upon next visit here.  3. H/o persistent mild leucopenia of unclear etiology - WBC is low normal, ANC is normal. Workup in June 2013 including manual differential, reticulocyte count, ANA, iron study, B12, folate, HBsAg, HCV Ab, LDH was unremarkable. Clinically doing well. WBC and ANC are in low normal range. She is asymptomatic. Plan is to continue monitor and repeat CBC with differential upon next visit here.  4. In between visits, the patient was advised to call in case of any new side effects from anastrozole, new breast masses felt on self-exam, unintentional weight loss or other new symptoms and will be evaluated sooner. She is agreeable to this plan.    Leia Alf, MD   08/02/2014 12:00 AM

## 2014-09-07 ENCOUNTER — Other Ambulatory Visit: Payer: Self-pay

## 2014-09-07 DIAGNOSIS — Z853 Personal history of malignant neoplasm of breast: Secondary | ICD-10-CM

## 2014-09-16 ENCOUNTER — Ambulatory Visit
Admission: RE | Admit: 2014-09-16 | Discharge: 2014-09-16 | Disposition: A | Discharge status: Home / Self Care | Payer: BLUE CROSS/BLUE SHIELD | Source: Ambulatory Visit | Attending: Radiation Oncology | Admitting: Radiation Oncology

## 2014-09-16 ENCOUNTER — Encounter: Payer: Self-pay | Admitting: Radiation Oncology

## 2014-09-16 VITALS — BP 135/82 | HR 56 | Temp 96.2°F | Resp 18 | Wt 159.7 lb

## 2014-09-16 DIAGNOSIS — C50911 Malignant neoplasm of unspecified site of right female breast: Secondary | ICD-10-CM

## 2014-09-16 NOTE — Progress Notes (Signed)
Radiation Oncology Follow up Note  Name: Ashley Lester   Date:   09/16/2014 MRN:  [id number] DOB: 1955/10/05    This 59 y.o. female presents to the clinic today for follow-up for stage I (T1 CN 0 M0 invasive mammary carcinoma ER/PR positive HER-2/neu negative status post whole breast radiation.Marland Kitchen  REFERRING PROVIDER: Albina Billet, MD  HPI: Patient is a 59 year old female now seen out over 3-1/2 years having completed whole breast radiation to her right breast for stage I invasive mammary carcinoma ER/PR positive HER-2/neu not overexpressed. Oncotype DX showed a low risk recurrence did not receive systemic chemotherapy. She is currently on aromatase inhibitor and tolerating that well without side effect. She specifically denies breast tenderness cough or bone pain. Follow-up mammograms have been fine..  COMPLICATIONS OF TREATMENT: none  FOLLOW UP COMPLIANCE: keeps appointments   PHYSICAL EXAM:  BP 135/82 mmHg  Pulse 56  Temp(Src) 96.2 F (35.7 C)  Resp 18  Wt 159 lb 11.6 oz (72.45 kg) Lungs are clear to A&P cardiac examination essentially unremarkable with regular rate and rhythm. No dominant mass or nodularity is noted in either breast in 2 positions examined. Incision is well-healed. No axillary or supraclavicular adenopathy is appreciated. Cosmetic result is excellent. Well-developed well-nourished patient in NAD. HEENT reveals PERLA, EOMI, discs not visualized.  Oral cavity is clear. No oral mucosal lesions are identified. Neck is clear without evidence of cervical or supraclavicular adenopathy. Lungs are clear to A&P. Cardiac examination is essentially unremarkable with regular rate and rhythm without murmur rub or thrill. Abdomen is benign with no organomegaly or masses noted. Motor sensory and DTR levels are equal and symmetric in the upper and lower extremities. Cranial nerves II through XII are grossly intact. Proprioception is intact. No peripheral adenopathy or edema is  identified. No motor or sensory levels are noted. Crude visual fields are within normal range.   RADIOLOGY RESULTS: Mammograms are reviewed  PLAN: At the present time she continues continues to do well with no evidence of disease. She's quite emotional about her medial tattoo I've given her permission to have that removed by dermatology. Otherwise she continues to do well with no evidence of disease. She continues on aromatase inhibitor without side effect. I've asked to see her back in 1 year for follow-up and then will discontinue follow-up care.  I would like to take this opportunity for allowing me to participate in the care of your patient.Armstead Peaks., MD

## 2014-11-25 ENCOUNTER — Other Ambulatory Visit: Payer: Self-pay | Admitting: General Surgery

## 2014-11-25 ENCOUNTER — Ambulatory Visit
Admission: RE | Admit: 2014-11-25 | Discharge: 2014-11-25 | Disposition: A | Discharge status: Home / Self Care | Payer: BLUE CROSS/BLUE SHIELD | Source: Ambulatory Visit | Attending: General Surgery | Admitting: General Surgery

## 2014-11-25 DIAGNOSIS — Z853 Personal history of malignant neoplasm of breast: Secondary | ICD-10-CM | POA: Insufficient documentation

## 2014-12-01 ENCOUNTER — Ambulatory Visit: Payer: BLUE CROSS/BLUE SHIELD | Admitting: General Surgery

## 2014-12-09 ENCOUNTER — Ambulatory Visit: Payer: BLUE CROSS/BLUE SHIELD | Admitting: General Surgery

## 2014-12-13 ENCOUNTER — Encounter: Payer: Self-pay | Admitting: General Surgery

## 2014-12-13 ENCOUNTER — Ambulatory Visit (INDEPENDENT_AMBULATORY_CARE_PROVIDER_SITE_OTHER): Payer: BLUE CROSS/BLUE SHIELD | Admitting: General Surgery

## 2014-12-13 VITALS — BP 130/74 | HR 76 | Resp 12 | Ht 66.0 in | Wt 162.0 lb

## 2014-12-13 DIAGNOSIS — Z853 Personal history of malignant neoplasm of breast: Secondary | ICD-10-CM

## 2014-12-13 NOTE — Patient Instructions (Signed)
Patient will be asked to return to the office in one year with a bilateral diagnotic  mammogram. 

## 2014-12-13 NOTE — Progress Notes (Signed)
Patient ID: Ashley Lester, female   DOB: 23-Jun-1955, 59 y.o.   MRN: [id number]  Chief Complaint  Patient presents with  . Follow-up    Mammogram    HPI Ashley Lester is a 59 y.o. female who presents for a breast evaluation. The most recent mammogram was done on 11/25/14 Patient does perform regular self breast checks and gets regular mammograms done.    HPI  Past Medical History  Diagnosis Date  . Cellulitis and abscess of unspecified site   . Epilepsy ([place])   . Seizure disorder ([place]) 1964  . Personal history of malignant neoplasm of breast     treated with right breast wide excision with sentinel node biopsy, mastoplasty, and radiation therapy  . Post-traumatic seroma   . Breast screening, unspecified   . Lump or mass in breast   . Special screening for malignant neoplasms, colon   . 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 Ganglion cyst excision  1980's  . Cholecystectomy  2005  . Colonoscopy  06/01/2008    Ashley Lester, M.D. Normal exam.  . Wisdom tooth extraction  1980's  . Breast surgery Right 2012    right breast wide excision and sentinel node biopsy completed on 11-21-10 identified a 1.6 cm histologic grade 2, ER/PR positive, HER-2/neu not overexpressing carcinoma with clear margins. Sentinel nodes x2 were negative for metastatic disease. Low Oncotype DX at 7%.  . Breast excisional biopsy Right     10/2010 positive  . Radiation therapy Right     breast cancer    Family History  Problem Relation Age of Onset  . Leukemia Sister 63  . Breast cancer Maternal Aunt     Social History Social History  Substance Use Topics  . Smoking  status: Never Smoker   . Smokeless tobacco: None  . Alcohol Use: No    No Known Allergies  Current Outpatient Prescriptions  Medication Sig Dispense Refill  . anastrozole (ARIMIDEX) 1 MG tablet Take 1 mg by mouth daily.    . Inulin-Calcium-Vitamin D (FIBERCHOICE PLUS CALCIUM PO) Take by mouth.    . Nutritional Supplements (JUICE PLUS FIBRE PO) Take by mouth.    . Omega-3 Fatty Acids (FISH OIL) 1000 MG CAPS Take by mouth daily.    Marland Kitchen PREVIDENT 5000 SENSITIVE 1.1-5 % PSTE USE AS DIRECTED 1 TIME DAILY  3  . Red Yeast Rice Extract (RED YEAST RICE PO) Take by mouth 2 (two) times daily.     No current facility-administered medications for this visit.    Review of Systems Review of Systems  Constitutional: Negative.   Respiratory: Negative.   Cardiovascular: Negative.     Blood pressure 130/74, pulse 76, resp. rate 12, height 5' 6" (1.676 m), weight 162 lb (73.483 kg).  Physical Exam Physical Exam  Constitutional: She is oriented to person, place, and time. She appears well-developed and well-nourished.  Eyes: Conjunctivae are normal. No scleral icterus.  Neck: Neck supple.  Cardiovascular: Normal rate, regular rhythm and normal heart sounds.   Pulmonary/Chest: Effort normal and breath sounds normal. Right breast exhibits no inverted nipple, no mass, no nipple discharge, no skin change and no tenderness. Left breast exhibits no  inverted nipple, no mass, no nipple discharge, no skin change and no tenderness.    Well healed incision right breast. Thicken at scar, no change since last visit.   Lymphadenopathy:    She has no cervical adenopathy.    She has no axillary adenopathy.  Neurological: She is alert and oriented to person, place, and time.  Skin: Skin is warm and dry.    Data Reviewed Bilateral mammograms dated 11/25/2014 were reviewed. No interval change. BI-RADS-2.  Colonoscopy completed 06/01/2008 by Ashley Lester, M.D. was normal.  Assessment    Doing well now 4  years status post management of a T1c, N0 carcinoma of the right breast. Tolerating anastrozole therapy.    Plan        Patient will be asked to return to the office in one year with a bilateral diagnotic mammogram.Continue self breast exams. Call office for any new breast issues or concerns.   PCP:  Almetta Lovely 12/13/2014, 12:39 PM

## 2015-02-28 ENCOUNTER — Other Ambulatory Visit: Payer: Self-pay | Admitting: *Deleted

## 2015-02-28 MED ORDER — ANASTROZOLE 1 MG PO TABS: 1.0000 mg | ORAL_TABLET | Freq: Every day | ORAL | Status: DC

## 2015-07-27 ENCOUNTER — Inpatient Hospital Stay (HOSPITAL_BASED_OUTPATIENT_CLINIC_OR_DEPARTMENT_OTHER): Payer: BLUE CROSS/BLUE SHIELD | Admitting: Oncology

## 2015-07-27 ENCOUNTER — Ambulatory Visit: Payer: BLUE CROSS/BLUE SHIELD | Admitting: Internal Medicine

## 2015-07-27 ENCOUNTER — Other Ambulatory Visit: Payer: Self-pay

## 2015-07-27 ENCOUNTER — Inpatient Hospital Stay: Payer: BLUE CROSS/BLUE SHIELD | Attending: Internal Medicine

## 2015-07-27 VITALS — BP 120/81 | HR 64 | Temp 97.5°F | Resp 18 | Wt 158.3 lb

## 2015-07-27 DIAGNOSIS — Z923 Personal history of irradiation: Secondary | ICD-10-CM | POA: Insufficient documentation

## 2015-07-27 DIAGNOSIS — Z79811 Long term (current) use of aromatase inhibitors: Secondary | ICD-10-CM

## 2015-07-27 DIAGNOSIS — D72819 Decreased white blood cell count, unspecified: Secondary | ICD-10-CM

## 2015-07-27 DIAGNOSIS — Z79899 Other long term (current) drug therapy: Secondary | ICD-10-CM | POA: Diagnosis not present

## 2015-07-27 DIAGNOSIS — G40909 Epilepsy, unspecified, not intractable, without status epilepticus: Secondary | ICD-10-CM | POA: Diagnosis not present

## 2015-07-27 DIAGNOSIS — C50411 Malignant neoplasm of upper-outer quadrant of right female breast: Secondary | ICD-10-CM

## 2015-07-27 DIAGNOSIS — Z170 Estrogen receptor positive status [ER+]: Secondary | ICD-10-CM | POA: Insufficient documentation

## 2015-07-27 DIAGNOSIS — C50911 Malignant neoplasm of unspecified site of right female breast: Secondary | ICD-10-CM

## 2015-07-27 DIAGNOSIS — R748 Abnormal levels of other serum enzymes: Secondary | ICD-10-CM | POA: Diagnosis not present

## 2015-07-27 LAB — HEPATIC FUNCTION PANEL
ALT: 57 U/L — ABNORMAL HIGH (ref 14–54)
AST: 50 U/L — ABNORMAL HIGH (ref 15–41)
Albumin: 4.3 g/dL (ref 3.5–5.0)
Alkaline Phosphatase: 107 U/L (ref 38–126)
Total Bilirubin: 0.5 mg/dL (ref 0.3–1.2)
Total Protein: 6.9 g/dL (ref 6.5–8.1)

## 2015-07-27 LAB — CREATININE, SERUM: CREATININE: 0.99 mg/dL (ref 0.44–1.00)

## 2015-07-27 LAB — CBC WITH DIFFERENTIAL/PLATELET
BASOS ABS: 0 10*3/uL (ref 0–0.1)
BASOS PCT: 1 %
EOS PCT: 3 %
Eosinophils Absolute: 0.1 10*3/uL (ref 0–0.7)
HCT: 40.1 % (ref 35.0–47.0)
Hemoglobin: 14 g/dL (ref 12.0–16.0)
Lymphocytes Relative: 25 %
Lymphs Abs: 1 10*3/uL (ref 1.0–3.6)
MCH: 31 pg (ref 26.0–34.0)
MCHC: 34.9 g/dL (ref 32.0–36.0)
MCV: 88.8 fL (ref 80.0–100.0)
MONO ABS: 0.3 10*3/uL (ref 0.2–0.9)
Monocytes Relative: 7 %
Neutro Abs: 2.5 10*3/uL (ref 1.4–6.5)
Neutrophils Relative %: 64 %
PLATELETS: 160 10*3/uL (ref 150–440)
RBC: 4.52 MIL/uL (ref 3.80–5.20)
RDW: 12.9 % (ref 11.5–14.5)
WBC: 3.9 10*3/uL (ref 3.6–11.0)

## 2015-07-27 MED ORDER — ANASTROZOLE 1 MG PO TABS: 1.0000 mg | ORAL_TABLET | Freq: Every day | ORAL | Status: DC

## 2015-07-27 NOTE — Progress Notes (Signed)
[name]  Telephone:(336) [phone]  Fax:(336) [phone]     Ashley Lester DOB: 02/25/1955  MR#: [id number]  CZY#:[id number]  Patient Care Team: Albina Billet, MD as PCP - General (Unknown Physician Specialty) Robert Bellow, MD (General Surgery)  CHIEF COMPLAINT:  Chief Complaint  Patient presents with  . Breast Cancer   pT1c pN0 (sn) cM0 (stage I) grade 2 invasive mammary carcinoma of the right breast s/p lumpectomy and sentinel node study on 11/21/2010. Tumor size 1.6 cm, grade 2 histology, margins negative. 2 sentinel lymph nodes negative for metastasis. ER and PR positive (80%). HER-2/neu negative by FISH (HER-2/CEP17 ratio 1.08).  ONCOTYPE Dx test 12/08/10 shows Breast cancer Recurrence score of 11 (low risk) with average rate of distant recurrence of 7%.  Started aromatase inhibitor early February 2013.  INTERVAL HISTORY: Patient returns to clinic today for one year follow up for breast cancer. She currently feels well and is asymptomatic. She takes anastrozole daily without side effects. She has no complaints today. She has a good appetite and denies weight loss. She denies fever or chills. She denies pain. She was last seen a year ago by Dr Ma Hillock.   REVIEW OF SYSTEMS:   Review of Systems  Constitutional: Negative.   HENT: Negative.   Eyes: Negative.   Respiratory: Negative.   Cardiovascular: Negative.   Gastrointestinal: Negative.   Genitourinary: Negative.   Musculoskeletal: Negative.   Skin: Negative.   Neurological: Negative.   Endo/Heme/Allergies: Negative.   Psychiatric/Behavioral: Negative.     As per HPI. Otherwise, a complete review of systems is negatve.  ONCOLOGY HISTORY:  No history exists.    PAST MEDICAL HISTORY: Past Medical History  Diagnosis Date  . Cellulitis and abscess of unspecified site   . Epilepsy ([place])   . Seizure disorder ([place]) 1964  . Personal history of malignant neoplasm of breast     treated with  right breast wide excision with sentinel node biopsy, mastoplasty, and radiation therapy  . Post-traumatic seroma   . Breast screening, unspecified   . Lump or mass in breast   . Special screening for malignant neoplasms, colon   . 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 Ganglion cyst excision  1980's  . Cholecystectomy  2005  . Colonoscopy  06/01/2008    Loistine Simas, M.D. Normal exam.  . Wisdom tooth extraction  1980's  . Breast surgery Right 2012    right breast wide excision and sentinel node biopsy completed on 11-21-10 identified a 1.6 cm histologic grade 2, ER/PR positive, HER-2/neu not overexpressing carcinoma with clear margins. Sentinel nodes x2 were negative for metastatic disease. Low Oncotype DX at 7%.  . Breast excisional biopsy Right     10/2010 positive  . Radiation therapy Right     breast cancer    FAMILY HISTORY Family History  Problem Relation Age of Onset  . Leukemia Sister 64  . Breast cancer Maternal Aunt     GYNECOLOGIC HISTORY:  No LMP recorded. Patient is postmenopausal.     ADVANCED DIRECTIVES:    HEALTH MAINTENANCE: Social History  Substance Use Topics  . Smoking status: Never Smoker   .  Smokeless tobacco: Not on file  . Alcohol Use: No    No Known Allergies  Current Outpatient Prescriptions  Medication Sig Dispense Refill  . anastrozole (ARIMIDEX) 1 MG tablet Take 1 tablet (1 mg total) by mouth daily. 90 tablet 2  . calcium acetate (PHOSLO) 667 MG tablet Take by mouth.    . Inulin-Calcium-Vitamin D (FIBERCHOICE PLUS CALCIUM PO) Take by mouth.    . Multiple Vitamin (MULTIVITAMIN) tablet Take 1  tablet by mouth daily.    . Nutritional Supplements (JUICE PLUS FIBRE PO) Take by mouth.    . Omega-3 Fatty Acids (FISH OIL) 1000 MG CAPS Take by mouth daily.    Marland Kitchen PREVIDENT 5000 SENSITIVE 1.1-5 % PSTE USE AS DIRECTED 1 TIME DAILY  3  . Probiotic Product (PROBIOTIC PO) Take 1 tablet by mouth as needed.     . Red Yeast Rice Extract (RED YEAST RICE PO) Take by mouth 2 (two) times daily.     No current facility-administered medications for this visit.    OBJECTIVE: BP 120/81 mmHg  Pulse 64  Temp(Src) 97.5 F (36.4 C) (Tympanic)  Resp 18  Wt 158 lb 4.6 oz (71.8 kg)   Body mass index is 25.56 kg/(m^2).    ECOG FS:0 - Asymptomatic  General: Well-developed, well-nourished, no acute distress. Eyes: Pink conjunctiva, anicteric sclera. HEENT: Normocephalic, moist mucous membranes, clear oropharnyx. Lungs: Clear to auscultation bilaterally. Heart: Regular rate and rhythm. No rubs, murmurs, or gallops. Abdomen: Soft, nontender, nondistended.  Breast: Breast exam is done by Dr Bary Castilla Musculoskeletal: No edema, cyanosis, or clubbing. Neuro: Alert, answering all questions appropriately. Skin: No rashes or petechiae noted. Psych: Normal affect.   LAB RESULTS:  Orders Only on 07/27/2015  Component Date Value Ref Range Status  . WBC 07/27/2015 3.9  3.6 - 11.0 K/uL Final  . RBC 07/27/2015 4.52  3.80 - 5.20 MIL/uL Final  . Hemoglobin 07/27/2015 14.0  12.0 - 16.0 g/dL Final  . HCT 07/27/2015 40.1  35.0 - 47.0 % Final  . MCV 07/27/2015 88.8  80.0 - 100.0 fL Final  . MCH 07/27/2015 31.0  26.0 - 34.0 pg Final  . MCHC 07/27/2015 34.9  32.0 - 36.0 g/dL Final  . RDW 07/27/2015 12.9  11.5 - 14.5 % Final  . Platelets 07/27/2015 160  150 - 440 K/uL Final  . Neutrophils Relative % 07/27/2015 64   Final  . Neutro Abs 07/27/2015 2.5  1.4 - 6.5 K/uL Final  . Lymphocytes Relative 07/27/2015 25   Final  . Lymphs Abs 07/27/2015 1.0  1.0 - 3.6 K/uL Final  . Monocytes Relative 07/27/2015 7   Final  .  Monocytes Absolute 07/27/2015 0.3  0.2 - 0.9 K/uL Final  . Eosinophils Relative 07/27/2015 3   Final  . Eosinophils Absolute 07/27/2015 0.1  0 - 0.7 K/uL Final  . Basophils Relative 07/27/2015 1   Final  . Basophils Absolute 07/27/2015 0.0  0 - 0.1 K/uL Final    STUDIES: No results found.  ASSESSMENT / PLAN   1. Breast cancer: Anastrozole started in February 2013. Will continue through February 2018. Last mammogram October 2016 was normal. Repeat in October 2017, ordered by Dr Bary Castilla. Bone Density scan in 2014 was normal. Will order repeat DEXA today. Follow up in February 2018.   2. Leukopenia - WBC continues to be normal. PCP will monitor from now on.  3. Elevated liver enzymes - ALT 57, AST 50. Monitor. Patient requests to continue monitoring at her visits here.  Will order for her next appointment in February 2018.  4. Creatinine- WNL today. Patient requests to monitor here once more at her next visit.  Patient expressed understanding and was in agreement with this plan. She also understands that She can call clinic at any time with any questions, concerns, or complaints.   Dr. Rogue Bussing was available for consultation and review of plan of care for this patient.  Mayra Reel, NP   07/27/2015 11:43 AM

## 2015-08-24 ENCOUNTER — Ambulatory Visit: Payer: BLUE CROSS/BLUE SHIELD

## 2015-09-07 ENCOUNTER — Ambulatory Visit
Admission: RE | Admit: 2015-09-07 | Discharge: 2015-09-07 | Disposition: A | Discharge status: Home / Self Care | Payer: BLUE CROSS/BLUE SHIELD | Source: Ambulatory Visit | Attending: Oncology | Admitting: Oncology

## 2015-09-07 ENCOUNTER — Encounter: Payer: Self-pay | Admitting: *Deleted

## 2015-09-07 DIAGNOSIS — C50911 Malignant neoplasm of unspecified site of right female breast: Secondary | ICD-10-CM | POA: Insufficient documentation

## 2015-09-07 DIAGNOSIS — M85852 Other specified disorders of bone density and structure, left thigh: Secondary | ICD-10-CM | POA: Insufficient documentation

## 2015-09-07 DIAGNOSIS — M8588 Other specified disorders of bone density and structure, other site: Secondary | ICD-10-CM | POA: Diagnosis not present

## 2015-09-07 DIAGNOSIS — Z780 Asymptomatic menopausal state: Secondary | ICD-10-CM | POA: Insufficient documentation

## 2015-09-07 NOTE — Patient Instructions (Signed)
Per Dr Rogue Bussing, would like to see patient in September rather than February due to BMD results showing osteopenia. I left message on patient VM to return my call, but did not tell her why

## 2015-09-08 ENCOUNTER — Telehealth: Payer: Self-pay | Admitting: *Deleted

## 2015-09-08 NOTE — Telephone Encounter (Signed)
Notified patient of request form Dr Burlene Arnt to move appointment from February to September due to BMD results showing osteopenia.

## 2015-09-22 ENCOUNTER — Ambulatory Visit
Admission: RE | Admit: 2015-09-22 | Discharge: 2015-09-22 | Disposition: A | Discharge status: Home / Self Care | Payer: BLUE CROSS/BLUE SHIELD | Source: Ambulatory Visit | Attending: Radiation Oncology | Admitting: Radiation Oncology

## 2015-09-22 ENCOUNTER — Encounter: Payer: Self-pay | Admitting: Radiation Oncology

## 2015-09-22 VITALS — BP 117/76 | HR 63 | Temp 97.3°F | Resp 18 | Wt 160.1 lb

## 2015-09-22 DIAGNOSIS — Z170 Estrogen receptor positive status [ER+]: Secondary | ICD-10-CM | POA: Diagnosis not present

## 2015-09-22 DIAGNOSIS — Z923 Personal history of irradiation: Secondary | ICD-10-CM | POA: Insufficient documentation

## 2015-09-22 DIAGNOSIS — Z79811 Long term (current) use of aromatase inhibitors: Secondary | ICD-10-CM | POA: Insufficient documentation

## 2015-09-22 DIAGNOSIS — C50911 Malignant neoplasm of unspecified site of right female breast: Secondary | ICD-10-CM | POA: Diagnosis present

## 2015-09-22 DIAGNOSIS — M8580 Other specified disorders of bone density and structure, unspecified site: Secondary | ICD-10-CM | POA: Insufficient documentation

## 2015-09-22 NOTE — Progress Notes (Signed)
Radiation Oncology Follow up Note  Name: Ashley Lester   Date:   09/22/2015 MRN:  [id number] DOB: 01/23/1956    This 60 y.o. female presents to the clinic today for 4-1/2 year follow-up status post radiation therapy to her right breast for stage I invasive mammary carcinoma ER/PR positive HER-2/neu negative.  REFERRING PROVIDER: Albina Billet, MD  HPI: Patient is a 60 year old female now seen out close to 5 years having completed radiation therapy to her right breast for a T1 CN 0 M0 invasive mammary carcinoma ER/PR positive HER-2/neu negative status post wide local excision and whole breast radiation she seen today in routine follow-up is doing well. Follow-up mammograms have been fine. She's currently on anastrozole recently had a bone density study showing some decreased bone density she is seeing medical oncology in about a week or 2 for discussion of that. She otherwise specifically denies breast tenderness cough or bone pain. She still wants to have a small skin tattoo from radiation removed..  COMPLICATIONS OF TREATMENT: none  FOLLOW UP COMPLIANCE: keeps appointments   PHYSICAL EXAM:  BP 117/76   Pulse 63   Temp 97.3 F (36.3 C)   Resp 18   Wt 160 lb 0.9 oz (72.6 kg)   BMI 27.47 kg/m  Lungs are clear to A&P cardiac examination essentially unremarkable with regular rate and rhythm. No dominant mass or nodularity is noted in either breast in 2 positions examined. Incision is well-healed. No axillary or supraclavicular adenopathy is appreciated. Cosmetic result is excellent. Well-developed well-nourished patient in NAD. HEENT reveals PERLA, EOMI, discs not visualized.  Oral cavity is clear. No oral mucosal lesions are identified. Neck is clear without evidence of cervical or supraclavicular adenopathy. Lungs are clear to A&P. Cardiac examination is essentially unremarkable with regular rate and rhythm without murmur rub or thrill. Abdomen is benign with no organomegaly or masses  noted. Motor sensory and DTR levels are equal and symmetric in the upper and lower extremities. Cranial nerves II through XII are grossly intact. Proprioception is intact. No peripheral adenopathy or edema is identified. No motor or sensory levels are noted. Crude visual fields are within normal range.  RADIOLOGY RESULTS: Mammograms reviewed showing, BI-RADS 2 benign. Bone density study also reviewed compatible with the above-stated findings  PLAN: At the present time she is doing well I'm going to discontinue follow-up care since were close to 5 years out. She'll continue close follow-up care with medical oncology. Bone density will be discussed with medical oncology. Patient knows to call with any concerns in the future.  I would like to take this opportunity to thank you for allowing me to participate in the care of your patient.Armstead Peaks., MD

## 2015-09-28 ENCOUNTER — Other Ambulatory Visit: Payer: Self-pay | Admitting: General Surgery

## 2015-09-28 DIAGNOSIS — R928 Other abnormal and inconclusive findings on diagnostic imaging of breast: Secondary | ICD-10-CM

## 2015-10-12 ENCOUNTER — Encounter: Payer: Self-pay | Admitting: *Deleted

## 2015-10-24 ENCOUNTER — Other Ambulatory Visit: Payer: Self-pay

## 2015-10-24 DIAGNOSIS — C50911 Malignant neoplasm of unspecified site of right female breast: Secondary | ICD-10-CM

## 2015-10-27 ENCOUNTER — Encounter: Payer: Self-pay | Admitting: Internal Medicine

## 2015-10-27 ENCOUNTER — Inpatient Hospital Stay: Payer: BLUE CROSS/BLUE SHIELD

## 2015-10-27 ENCOUNTER — Inpatient Hospital Stay: Payer: BLUE CROSS/BLUE SHIELD | Attending: Oncology | Admitting: Internal Medicine

## 2015-10-27 DIAGNOSIS — Z79811 Long term (current) use of aromatase inhibitors: Secondary | ICD-10-CM

## 2015-10-27 DIAGNOSIS — Z170 Estrogen receptor positive status [ER+]: Secondary | ICD-10-CM

## 2015-10-27 DIAGNOSIS — Z923 Personal history of irradiation: Secondary | ICD-10-CM

## 2015-10-27 DIAGNOSIS — Z803 Family history of malignant neoplasm of breast: Secondary | ICD-10-CM | POA: Diagnosis not present

## 2015-10-27 DIAGNOSIS — C50811 Malignant neoplasm of overlapping sites of right female breast: Secondary | ICD-10-CM

## 2015-10-27 DIAGNOSIS — M8580 Other specified disorders of bone density and structure, unspecified site: Secondary | ICD-10-CM | POA: Diagnosis not present

## 2015-10-27 DIAGNOSIS — Z806 Family history of leukemia: Secondary | ICD-10-CM

## 2015-10-27 DIAGNOSIS — G40909 Epilepsy, unspecified, not intractable, without status epilepticus: Secondary | ICD-10-CM

## 2015-10-27 DIAGNOSIS — C50911 Malignant neoplasm of unspecified site of right female breast: Secondary | ICD-10-CM

## 2015-10-27 LAB — CBC WITH DIFFERENTIAL/PLATELET
BASOS PCT: 1 %
Basophils Absolute: 0 10*3/uL (ref 0–0.1)
EOS ABS: 0.2 10*3/uL (ref 0–0.7)
EOS PCT: 5 %
HCT: 40.2 % (ref 35.0–47.0)
HEMOGLOBIN: 13.8 g/dL (ref 12.0–16.0)
LYMPHS ABS: 1.1 10*3/uL (ref 1.0–3.6)
Lymphocytes Relative: 30 %
MCH: 30.4 pg (ref 26.0–34.0)
MCHC: 34.3 g/dL (ref 32.0–36.0)
MCV: 88.5 fL (ref 80.0–100.0)
MONO ABS: 0.3 10*3/uL (ref 0.2–0.9)
MONOS PCT: 8 %
NEUTROS PCT: 56 %
Neutro Abs: 2.1 10*3/uL (ref 1.4–6.5)
PLATELETS: 157 10*3/uL (ref 150–440)
RBC: 4.55 MIL/uL (ref 3.80–5.20)
RDW: 13.3 % (ref 11.5–14.5)
WBC: 3.8 10*3/uL (ref 3.6–11.0)

## 2015-10-27 LAB — COMPREHENSIVE METABOLIC PANEL
ALBUMIN: 4.1 g/dL (ref 3.5–5.0)
ALK PHOS: 89 U/L (ref 38–126)
ALT: 44 U/L (ref 14–54)
ANION GAP: 4 — AB (ref 5–15)
AST: 45 U/L — ABNORMAL HIGH (ref 15–41)
BUN: 11 mg/dL (ref 6–20)
CALCIUM: 9.5 mg/dL (ref 8.9–10.3)
CHLORIDE: 103 mmol/L (ref 101–111)
CO2: 30 mmol/L (ref 22–32)
Creatinine, Ser: 0.92 mg/dL (ref 0.44–1.00)
GFR calc non Af Amer: >60 mL/min (ref >60)
GLUCOSE: 94 mg/dL (ref 65–99)
POTASSIUM: 4.6 mmol/L (ref 3.5–5.1)
SODIUM: 137 mmol/L (ref 135–145)
Total Bilirubin: 0.7 mg/dL (ref 0.3–1.2)
Total Protein: 6.7 g/dL (ref 6.5–8.1)

## 2015-10-27 NOTE — Assessment & Plan Note (Addendum)
#   Right breast cancer; Stage I ER/PR Her 2 Neu-NEG; low risk Oncotype- on anastrazole [Feb 2013]. clinicaly NED. Due for mammogram in October 2017/evaluation with surgery  #;discussed re; extended adjuvant anti-hormone therapy [atom/atlas trial]; pt not too keen on continiung for 10 years.  Clinically, less likley to benefit from extended AI; however check breast cancer index.   # Osteopenia- discussed re: Fosomax; potential SE- with GERD; pt has been excising/ ca+ vit D. Wants to hold off at this time.   #  Follow up in 6 months./No labs.

## 2015-10-27 NOTE — Progress Notes (Signed)
Pt complains of a right cramping under arm when she goes to reach for something.  Very sharp sensation

## 2015-10-27 NOTE — Progress Notes (Signed)
[place] OFFICE PROGRESS NOTE  Patient Care Team: Albina Billet, MD as PCP - General (Unknown Physician Specialty) Robert Bellow, MD (General Surgery)  No matching staging information was found for the patient.   Oncology History   OCT 2012- pT1c pN0 (sn) cM0 (stage I) grade 2 invasive mammary carcinoma of the right breast s/p lumpectomy and sentinel node.   Tumor size 1.6 cm, grade 2 histology, margins negative.  2 sentinel lymph nodes negative for metastasis. ER and PR positive (80%). HER-2/neu negative by FISH (HER-2/CEP17 ratio 1.08).  ONCOTYPE Dx test 12/08/10 shows Breast cancer Recurrence score of 11 (low risk) with average rate of distant recurrence of 7%.  Started aromatase inhibitor early February 2013.  # AUG 2017- BMD- OSTEOPENIA     Malignant neoplasm of overlapping sites of right breast [facility])    This is my first interaction with the patient as patient's primary oncologist has been [place]. I reviewed the patient's prior charts/pertinent labs/imaging in detail; findings are summarized above.     INTERVAL HISTORY:  Ashley Lester 60 y.o.  female pleasant patient above history of Stage I breast cancer right-sided on anastrozole is here for follow-up/to review the results of the bone density.  Patient admits to mild weight gain; otherwise denies any unusual bone pain. Denies any unusual arthritis. Denies any significant hot flashes.  No chest pain or shortness of breath or cough. No nausea no vomiting. She is concerned about the bone density test.  REVIEW OF SYSTEMS:  A complete 10 point review of system is done which is negative except mentioned above/history of present illness.   PAST MEDICAL HISTORY :  Past Medical History:  Diagnosis Date  . Breast screening, unspecified   . Cellulitis and abscess of unspecified site   . Epilepsy ([place])   . Lump or mass in breast   . Malignant neoplasm of upper-outer quadrant of female breast  ([place])    diagnosed on 11-22-10; right breast cancer, invasive mammary cancer, 1.6 cm, Sentinel lymph node negative, ER/PR positive, HER-2/neu is not amplified, Oncotype Dx assay recurrent score 11 (7% chance of distant recurrence at 10 years)  . Personal history of malignant neoplasm of breast    treated with right breast wide excision with sentinel node biopsy, mastoplasty, and radiation therapy  . Post-traumatic seroma   . Seizure disorder ([place]) 1964  . Special screening for malignant neoplasms, colon     PAST SURGICAL HISTORY :   Past Surgical History:  Procedure Laterality Date  . BREAST EXCISIONAL BIOPSY Right    10/2010 positive  . BREAST SURGERY Right 2012   right breast wide excision and sentinel node biopsy completed on 11-21-10 identified a 1.6 cm histologic grade 2, ER/PR positive, HER-2/neu not overexpressing carcinoma with clear margins. Sentinel nodes x2 were negative for metastatic disease. Low Oncotype DX at 7%.  . CHOLECYSTECTOMY  2005  . COLONOSCOPY  06/01/2008   Loistine Simas, M.D. Normal exam.  . GANGLION CYST EXCISION  1980's  . radiation therapy Right    breast cancer  . WISDOM TOOTH EXTRACTION  1980's    FAMILY HISTORY :   Family History  Problem Relation Age of Onset  . Leukemia Sister 79  . Breast cancer Maternal Aunt     SOCIAL HISTORY:   Social History  Substance Use Topics  . Smoking status: Never Smoker  . Smokeless tobacco: Never Used  . Alcohol use No    ALLERGIES:  has No Known Allergies.  MEDICATIONS:  Current Outpatient Prescriptions  Medication Sig Dispense Refill  . anastrozole (ARIMIDEX) 1 MG tablet Take 1 tablet (1 mg total) by mouth daily. 90 tablet 2  . calcium acetate (PHOSLO) 667 MG tablet Take by mouth.    . Inulin-Calcium-Vitamin D (FIBERCHOICE PLUS CALCIUM PO) Take by mouth.    . Nutritional Supplements (JUICE PLUS FIBRE PO) Take by mouth.    . Omega-3 Fatty Acids (FISH OIL) 1000 MG CAPS Take by mouth daily.    Marland Kitchen  PREVIDENT 5000 SENSITIVE 1.1-5 % PSTE USE AS DIRECTED 1 TIME DAILY  3  . Red Yeast Rice Extract (RED YEAST RICE PO) Take by mouth 2 (two) times daily.    . Probiotic Product (PROBIOTIC PO) Take 1 tablet by mouth as needed.      No current facility-administered medications for this visit.     PHYSICAL EXAMINATION: ECOG PERFORMANCE STATUS: 0 - Asymptomatic  BP 131/84 (BP Location: Left Arm, Patient Position: Sitting)   Pulse 61   Temp 97.5 F (36.4 C) (Tympanic)   Resp 16   Ht _0  (1.626 m)   Wt 162 lb 6.4 oz (73.7 kg)   BMI 27.88 kg/m   Filed Weights   10/27/15 1019  Weight: 162 lb 6.4 oz (73.7 kg)    GENERAL: Well-nourished well-developed; Alert, no distress and comfortable.   Alone. EYES: no pallor or icterus OROPHARYNX: no thrush or ulceration; good dentition  NECK: supple, no masses felt LYMPH:  no palpable lymphadenopathy in the cervical, axillary or inguinal regions LUNGS: clear to auscultation and  No wheeze or crackles HEART/CVS: regular rate & rhythm and no murmurs; No lower extremity edema ABDOMEN:abdomen soft, non-tender and normal bowel sounds Musculoskeletal:no cyanosis of digits and no clubbing  PSYCH: alert & oriented x 3 with fluent speech NEURO: no focal motor/sensory deficits SKIN:  no rashes or significant lesions Right and left BREAST exam [in the presence of nurse]- scar tissue noted in the right lumpectomy region superiorly. no unusual skin changes or dominant masses felt. Surgical scars noted.    LABORATORY DATA:  I have reviewed the data as listed    Component Value Date/Time   NA 137 10/27/2015 1000   K 4.6 10/27/2015 1000   CL 103 10/27/2015 1000   CO2 30 10/27/2015 1000   GLUCOSE 94 10/27/2015 1000   BUN 11 10/27/2015 1000   CREATININE 0.92 10/27/2015 1000   CREATININE 0.94 06/10/2013 1010   CALCIUM 9.5 10/27/2015 1000   PROT 6.7 10/27/2015 1000   PROT 7.1 06/10/2013 1010   ALBUMIN 4.1 10/27/2015 1000   ALBUMIN 3.9 06/10/2013 1010    AST 45 (H) 10/27/2015 1000   AST 60 (H) 06/10/2013 1010   ALT 44 10/27/2015 1000   ALT 85 (H) 06/10/2013 1010   ALKPHOS 89 10/27/2015 1000   ALKPHOS 125 (H) 06/10/2013 1010   BILITOT 0.7 10/27/2015 1000   BILITOT 0.4 06/10/2013 1010   GFRNONAA >60 10/27/2015 1000   GFRNONAA >60 06/10/2013 1010   GFRAA >60 10/27/2015 1000   GFRAA >60 06/10/2013 1010    No results found for: SPEP, UPEP  Lab Results  Component Value Date   WBC 3.8 10/27/2015   NEUTROABS 2.1 10/27/2015   HGB 13.8 10/27/2015   HCT 40.2 10/27/2015   MCV 88.5 10/27/2015   PLT 157 10/27/2015      Chemistry      Component Value Date/Time   NA 137 10/27/2015 1000   K 4.6 10/27/2015 1000  CL 103 10/27/2015 1000   CO2 30 10/27/2015 1000   BUN 11 10/27/2015 1000   CREATININE 0.92 10/27/2015 1000   CREATININE 0.94 06/10/2013 1010      Component Value Date/Time   CALCIUM 9.5 10/27/2015 1000   ALKPHOS 89 10/27/2015 1000   ALKPHOS 125 (H) 06/10/2013 1010   AST 45 (H) 10/27/2015 1000   AST 60 (H) 06/10/2013 1010   ALT 44 10/27/2015 1000   ALT 85 (H) 06/10/2013 1010   BILITOT 0.7 10/27/2015 1000   BILITOT 0.4 06/10/2013 1010       RADIOGRAPHIC STUDIES: I have personally reviewed the radiological images as listed and agreed with the findings in the report. No results found.   ASSESSMENT & PLAN:  Malignant neoplasm of overlapping sites of right breast ([place]) # Right breast cancer; Stage I ER/PR Her 2 Neu-NEG; low risk Oncotype- on anastrazole [Feb 2013]. clinicaly NED. Due for mammogram in October 2017/evaluation with surgery  #;discussed re; extended adjuvant anti-hormone therapy [atom/atlas trial]; pt not too keen on continiung for 10 years.  Clinically, less likley to benefit from extended AI; however check breast cancer index.   # Osteopenia- discussed re: Fosomax; potential SE- with GERD; pt has been excising/ ca+ vit D. Wants to hold off at this time.   #  Follow up in 6 months./No labs.  # 25  minutes face-to-face with the patient discussing the above plan of care; more than 50% of time spent on prognosis/ natural history; counseling and coordination.  No orders of the defined types were placed in this encounter.  All questions were answered. The patient knows to call the clinic with any problems, questions or concerns.      Cammie Sickle, MD 10/27/2015 4:39 PM

## 2015-11-07 ENCOUNTER — Encounter: Payer: Self-pay | Admitting: Internal Medicine

## 2015-11-15 ENCOUNTER — Encounter: Payer: Self-pay | Admitting: Internal Medicine

## 2015-11-15 DIAGNOSIS — C50811 Malignant neoplasm of overlapping sites of right female breast: Secondary | ICD-10-CM | POA: Insufficient documentation

## 2015-11-15 DIAGNOSIS — Z170 Estrogen receptor positive status [ER+]: Secondary | ICD-10-CM | POA: Insufficient documentation

## 2015-11-15 HISTORY — DX: Estrogen receptor positive status (ER+): Z17.0

## 2015-11-28 ENCOUNTER — Other Ambulatory Visit: Payer: Self-pay | Admitting: General Surgery

## 2015-11-28 ENCOUNTER — Ambulatory Visit
Admission: RE | Admit: 2015-11-28 | Discharge: 2015-11-28 | Disposition: A | Discharge status: Home / Self Care | Payer: BLUE CROSS/BLUE SHIELD | Source: Ambulatory Visit | Attending: General Surgery | Admitting: General Surgery

## 2015-11-28 DIAGNOSIS — R928 Other abnormal and inconclusive findings on diagnostic imaging of breast: Secondary | ICD-10-CM

## 2015-11-28 HISTORY — DX: Malignant neoplasm of unspecified site of unspecified female breast: C50.919

## 2015-12-06 ENCOUNTER — Ambulatory Visit: Payer: BLUE CROSS/BLUE SHIELD | Admitting: General Surgery

## 2015-12-08 ENCOUNTER — Inpatient Hospital Stay: Payer: Self-pay

## 2015-12-08 ENCOUNTER — Ambulatory Visit (INDEPENDENT_AMBULATORY_CARE_PROVIDER_SITE_OTHER): Payer: BLUE CROSS/BLUE SHIELD | Admitting: General Surgery

## 2015-12-08 VITALS — BP 122/70 | HR 78 | Resp 12 | Ht 66.0 in | Wt 165.0 lb

## 2015-12-08 DIAGNOSIS — N6310 Unspecified lump in the right breast, unspecified quadrant: Secondary | ICD-10-CM | POA: Diagnosis not present

## 2015-12-08 DIAGNOSIS — Z853 Personal history of malignant neoplasm of breast: Secondary | ICD-10-CM

## 2015-12-08 NOTE — Progress Notes (Signed)
Patient ID: Ashley Lester, female   DOB: 10/23/1955, 60 y.o.   MRN: [id number]  Chief Complaint  Patient presents with  . Follow-up    mammogram    HPI Ashley Lester is a 60 y.o. female who presents for a breast evaluation. The most recent mammogram was done on 11/28/2015 .  Patient does perform regular self breast checks and gets regular mammograms done.  Patient states she felt a lump in her right breast she noticed this area about a week ago.   HPI  Past Medical History:  Diagnosis Date  . Breast cancer ([place]) 2012   right breast, radiation  . Breast screening, unspecified   . Cellulitis and abscess of unspecified site   . Epilepsy ([place])   . Lump or mass in breast   . 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 Personal history of malignant neoplasm of breast    treated with right breast wide excision with sentinel node biopsy, mastoplasty, and radiation therapy  . 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 Seizure disorder ([place]) 1964  . Special screening for malignant neoplasms, colon     Past Surgical History:  Procedure Laterality Date  . BREAST EXCISIONAL BIOPSY Right    10/2010 positive  . BREAST SURGERY Right 2012   right breast wide excision and sentinel node biopsy completed on 11-21-10 identified a 1.6 cm histologic grade 2, ER/PR positive, HER-2/neu not overexpressing carcinoma with clear margins. Sentinel nodes x2 were negative for metastatic disease. Low Oncotype DX at 7%.  . CHOLECYSTECTOMY  2005  . COLONOSCOPY  06/01/2008   Loistine Simas, M.D. Normal exam.  . GANGLION CYST EXCISION  1980's  . radiation therapy Right    breast cancer  . WISDOM TOOTH EXTRACTION  1980's    Family History  Problem  Relation Age of Onset  . Leukemia Sister 4  . Breast cancer Maternal Aunt     Social History Social History  Substance Use Topics  . Smoking status: Never Smoker  . Smokeless tobacco: Never Used  . Alcohol use No    No Known Allergies  Current Outpatient Prescriptions  Medication Sig Dispense Refill  . anastrozole (ARIMIDEX) 1 MG tablet Take 1 tablet (1 mg total) by mouth daily. 90 tablet 2  . Inulin-Calcium-Vitamin D (FIBERCHOICE PLUS CALCIUM PO) Take by mouth.    . Nutritional Supplements (JUICE PLUS FIBRE PO) Take by mouth.    . Omega-3 Fatty Acids (FISH OIL) 1000 MG CAPS Take by mouth daily.    Marland Kitchen PREVIDENT 5000 SENSITIVE 1.1-5 % PSTE USE AS DIRECTED 1 TIME DAILY  3  . Probiotic Product (PROBIOTIC PO) Take 1 tablet by mouth as needed.     . Red Yeast Rice Extract (RED YEAST RICE PO) Take by mouth 2 (two) times daily.     No current facility-administered medications for this visit.     Review of Systems Review of Systems  Constitutional: Negative.   Respiratory: Negative.   Cardiovascular: Negative.     Blood pressure 122/70, pulse 78, resp. rate 12, height '5\' 6"'  (1.676 m), weight 165 lb (74.8 kg).  Physical Exam Physical Exam  Constitutional: She appears well-developed and well-nourished.  Eyes: Conjunctivae are normal. No scleral icterus.  Neck: Neck supple.  Cardiovascular: Normal rate, regular rhythm and normal heart sounds.   Pulmonary/Chest: Effort normal and breath sounds normal. Right breast exhibits no inverted nipple, no mass, no nipple discharge, no skin change and no tenderness. Left breast exhibits no inverted nipple, no mass, no nipple discharge, no skin change and no tenderness.    Right costochondritis.  Abdominal: Soft. Bowel sounds are normal. There is no tenderness.  Lymphadenopathy:    She has no cervical adenopathy.    She has no axillary adenopathy.  Neurological: She is alert.  Skin: Skin is warm and dry.    Data Reviewed Bilateral  mammograms dated 11/28/2015 noted. Postsurgical changes on the right. BI-RADS-2.  Ultrasound examination was undertaken of the area of patient concern in the upper-outer quadrant of the right breast. This showed normal echo architecture. The breast parenchyma did approximate the underlying dermis significantly in the area of palpable thickening. Spacing and the rest the breast showed almost 0.7-0.8 cm between the skin and the breast parenchyma. In the area of palpable thickening a slight rise in the breast parenchyma to within 0.34 cm of the overlying skin was appreciated. This appears to account for the palpable findings. BI-RADS-2.  BCI testing showed the patient was of low benefit from ongoing antiestrogen therapy after 5 years.  Assessment    Benign breast exam.      Plan    The patient will follow-up with medical oncology as previously scheduled. Anticipate her completing 5 years of adjuvant anti-hormonal therapy in the near future. No apparent benefit from extended therapy based on BCI testing.    The patient has been asked to return to the office in one year with a bilateral diagnostic mammogram.   This information has been scribed by Gaspar Cola CMA.    Robert Bellow 12/08/2015, 9:09 PM

## 2015-12-08 NOTE — Patient Instructions (Signed)
The patient has been asked to return to the office in one year with a bilateral diagnostic mammogram. 

## 2016-03-28 ENCOUNTER — Ambulatory Visit: Payer: BLUE CROSS/BLUE SHIELD | Admitting: Internal Medicine

## 2016-03-28 ENCOUNTER — Inpatient Hospital Stay: Payer: BLUE CROSS/BLUE SHIELD | Admitting: Internal Medicine

## 2016-03-28 ENCOUNTER — Other Ambulatory Visit: Payer: BLUE CROSS/BLUE SHIELD

## 2016-04-05 ENCOUNTER — Ambulatory Visit: Payer: BLUE CROSS/BLUE SHIELD | Admitting: Internal Medicine

## 2016-04-05 ENCOUNTER — Other Ambulatory Visit: Payer: BLUE CROSS/BLUE SHIELD

## 2016-04-06 ENCOUNTER — Inpatient Hospital Stay (HOSPITAL_BASED_OUTPATIENT_CLINIC_OR_DEPARTMENT_OTHER): Payer: BLUE CROSS/BLUE SHIELD | Admitting: Internal Medicine

## 2016-04-06 ENCOUNTER — Inpatient Hospital Stay: Payer: BLUE CROSS/BLUE SHIELD | Attending: Internal Medicine

## 2016-04-06 VITALS — BP 138/86 | HR 76 | Temp 97.6°F | Resp 20 | Ht 66.0 in | Wt 160.0 lb

## 2016-04-06 DIAGNOSIS — M8580 Other specified disorders of bone density and structure, unspecified site: Secondary | ICD-10-CM | POA: Insufficient documentation

## 2016-04-06 DIAGNOSIS — C50911 Malignant neoplasm of unspecified site of right female breast: Secondary | ICD-10-CM

## 2016-04-06 DIAGNOSIS — Z79811 Long term (current) use of aromatase inhibitors: Secondary | ICD-10-CM | POA: Diagnosis not present

## 2016-04-06 DIAGNOSIS — C50811 Malignant neoplasm of overlapping sites of right female breast: Secondary | ICD-10-CM

## 2016-04-06 DIAGNOSIS — Z923 Personal history of irradiation: Secondary | ICD-10-CM | POA: Insufficient documentation

## 2016-04-06 DIAGNOSIS — Z170 Estrogen receptor positive status [ER+]: Secondary | ICD-10-CM | POA: Diagnosis not present

## 2016-04-06 DIAGNOSIS — Z79899 Other long term (current) drug therapy: Secondary | ICD-10-CM | POA: Insufficient documentation

## 2016-04-06 DIAGNOSIS — G40909 Epilepsy, unspecified, not intractable, without status epilepticus: Secondary | ICD-10-CM

## 2016-04-06 LAB — HEPATIC FUNCTION PANEL
ALBUMIN: 4.4 g/dL (ref 3.5–5.0)
ALT: 24 U/L (ref 14–54)
AST: 33 U/L (ref 15–41)
Alkaline Phosphatase: 73 U/L (ref 38–126)
BILIRUBIN DIRECT: 0.2 mg/dL (ref 0.1–0.5)
BILIRUBIN TOTAL: 0.7 mg/dL (ref 0.3–1.2)
Indirect Bilirubin: 0.5 mg/dL (ref 0.3–0.9)
Total Protein: 7 g/dL (ref 6.5–8.1)

## 2016-04-06 LAB — CREATININE, SERUM: CREATININE: 0.94 mg/dL (ref 0.44–1.00)

## 2016-04-06 NOTE — Progress Notes (Signed)
[place] OFFICE PROGRESS NOTE  Patient Care Team: Albina Billet, MD as PCP - General (Unknown Physician Specialty) Robert Bellow, MD (General Surgery)  Cancer Staging No matching staging information was found for the patient.   Oncology History   OCT 2012- pT1c pN0 (sn) cM0 (stage I) grade 2 invasive mammary carcinoma of the right breast s/p lumpectomy and sentinel node.   Tumor size 1.6 cm, grade 2 histology, margins negative.  2 sentinel lymph nodes negative for metastasis. ER and PR positive (80%). HER-2/neu negative by FISH (HER-2/CEP17 ratio 1.08).  ONCOTYPE Dx test 12/08/10 shows Breast cancer Recurrence score of 11 (low risk) with average rate of distant recurrence of 7%.  Started aromatase inhibitor early February 2013; OCT 2017- BCI- low benefit for extended Anti-hormone [3.6% over 5-10 years]; March 2018-STOP arimidex  # AUG 2017- BMD- OSTEOPENIA     Malignant neoplasm of overlapping sites of right breast ([place]) (Resolved)    Carcinoma of overlapping sites of right breast in female, estrogen receptor positive ([place])   11/15/2015 Initial Diagnosis    Carcinoma of overlapping sites of right breast in female, estrogen receptor positive ([place])        INTERVAL HISTORY:  Ashley Lester 61 y.o.  female pleasant patient above history of Stage I breast cancer right-sided on anastrozole is here for follow-up/to review the results of the breast cancer index.   Denies any unusual bone pain. Denies any unusual arthritis. Denies any significant hot flashes. No chest pain or shortness of breath or cough. No nausea no vomiting.   REVIEW OF SYSTEMS:  A complete 10 point review of system is done which is negative except mentioned above/history of present illness.   PAST MEDICAL HISTORY :  Past Medical History:  Diagnosis Date  . Breast cancer ([place]) 2012   right breast, radiation  . Breast screening, unspecified   . Cellulitis and abscess of unspecified  site   . Epilepsy ([place])   . Lump or mass in breast   . 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 Personal history of malignant neoplasm of breast    treated with right breast wide excision with sentinel node biopsy, mastoplasty, and radiation therapy  . 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 Seizure disorder ([place]) 1964  . Special screening for malignant neoplasms, colon     PAST SURGICAL HISTORY :   Past Surgical History:  Procedure Laterality Date  . BREAST EXCISIONAL BIOPSY Right    10/2010 positive  . BREAST SURGERY Right 2012   right breast wide excision and sentinel node biopsy completed on 11-21-10 identified a 1.6 cm histologic grade 2, ER/PR positive, HER-2/neu not overexpressing carcinoma with clear margins. Sentinel nodes x2 were negative for metastatic disease. Low Oncotype DX at 7%.  . CHOLECYSTECTOMY  2005  . COLONOSCOPY  06/01/2008   Loistine Simas, M.D. Normal exam.  . GANGLION CYST EXCISION  1980's  . radiation therapy Right    breast cancer  . WISDOM TOOTH EXTRACTION  1980's    FAMILY HISTORY :   Family History  Problem Relation Age of Onset  . Leukemia Sister 2  . Breast cancer Maternal Aunt     SOCIAL HISTORY:   Social History  Substance Use Topics  .  Smoking status: Never Smoker  . Smokeless tobacco: Never Used  . Alcohol use No    ALLERGIES:  has No Known Allergies.  MEDICATIONS:  Current Outpatient Prescriptions  Medication Sig Dispense Refill  . anastrozole (ARIMIDEX) 1 MG tablet Take 1 tablet (1 mg total) by mouth daily. 90 tablet 2  . Calcium Carb-Cholecalciferol (CALCIUM 1000 + D PO) Take 1 tablet by mouth daily.    . Inulin-Calcium-Vitamin D (FIBERCHOICE PLUS CALCIUM PO) Take 1 tablet  by mouth daily.     . Nutritional Supplements (JUICE PLUS FIBRE PO) Take by mouth.    . Omega-3 Fatty Acids (FISH OIL) 1000 MG CAPS Take by mouth daily.    Marland Kitchen PREVIDENT 5000 SENSITIVE 1.1-5 % PSTE USE AS DIRECTED 1 TIME DAILY  3  . Red Yeast Rice Extract (RED YEAST RICE PO) Take by mouth 2 (two) times daily.     No current facility-administered medications for this visit.     PHYSICAL EXAMINATION: ECOG PERFORMANCE STATUS: 0 - Asymptomatic  BP 138/86 (BP Location: Left Arm, Patient Position: Sitting)   Pulse 76   Temp 97.6 F (36.4 C) (Tympanic)   Resp 20   Ht '5\' 6"'  (1.676 m)   Wt 160 lb (72.6 kg)   BMI 25.82 kg/m   Filed Weights   04/06/16 0846  Weight: 160 lb (72.6 kg)    GENERAL: Well-nourished well-developed; Alert, no distress and comfortable.   Alone. EYES: no pallor or icterus OROPHARYNX: no thrush or ulceration; good dentition  NECK: supple, no masses felt LYMPH:  no palpable lymphadenopathy in the cervical, axillary or inguinal regions LUNGS: clear to auscultation and  No wheeze or crackles HEART/CVS: regular rate & rhythm and no murmurs; No lower extremity edema ABDOMEN:abdomen soft, non-tender and normal bowel sounds Musculoskeletal:no cyanosis of digits and no clubbing  PSYCH: alert & oriented x 3 with fluent speech NEURO: no focal motor/sensory deficits SKIN:  no rashes or significant lesions Right and left BREAST exam [in the presence of nurse]- scar tissue noted in the right lumpectomy region superiorly. no unusual skin changes or dominant masses felt. Surgical scars noted.    LABORATORY DATA:  I have reviewed the data as listed    Component Value Date/Time   NA 137 10/27/2015 1000   K 4.6 10/27/2015 1000   CL 103 10/27/2015 1000   CO2 30 10/27/2015 1000   GLUCOSE 94 10/27/2015 1000   BUN 11 10/27/2015 1000   CREATININE 0.94 04/06/2016 0825   CREATININE 0.94 06/10/2013 1010   CALCIUM 9.5 10/27/2015 1000   PROT 7.0 04/06/2016 0825   PROT 7.1  06/10/2013 1010   ALBUMIN 4.4 04/06/2016 0825   ALBUMIN 3.9 06/10/2013 1010   AST 33 04/06/2016 0825   AST 60 (H) 06/10/2013 1010   ALT 24 04/06/2016 0825   ALT 85 (H) 06/10/2013 1010   ALKPHOS 73 04/06/2016 0825   ALKPHOS 125 (H) 06/10/2013 1010   BILITOT 0.7 04/06/2016 0825   BILITOT 0.4 06/10/2013 1010   GFRNONAA >60 04/06/2016 0825   GFRNONAA >60 06/10/2013 1010   GFRAA >60 04/06/2016 0825   GFRAA >60 06/10/2013 1010    No results found for: SPEP, UPEP  Lab Results  Component Value Date   WBC 3.8 10/27/2015   NEUTROABS 2.1 10/27/2015   HGB 13.8 10/27/2015   HCT 40.2 10/27/2015   MCV 88.5 10/27/2015   PLT 157 10/27/2015      Chemistry      Component Value Date/Time  NA 137 10/27/2015 1000   K 4.6 10/27/2015 1000   CL 103 10/27/2015 1000   CO2 30 10/27/2015 1000   BUN 11 10/27/2015 1000   CREATININE 0.94 04/06/2016 0825   CREATININE 0.94 06/10/2013 1010      Component Value Date/Time   CALCIUM 9.5 10/27/2015 1000   ALKPHOS 73 04/06/2016 0825   ALKPHOS 125 (H) 06/10/2013 1010   AST 33 04/06/2016 0825   AST 60 (H) 06/10/2013 1010   ALT 24 04/06/2016 0825   ALT 85 (H) 06/10/2013 1010   BILITOT 0.7 04/06/2016 0825   BILITOT 0.4 06/10/2013 1010       RADIOGRAPHIC STUDIES: I have personally reviewed the radiological images as listed and agreed with the findings in the report. No results found.   ASSESSMENT & PLAN:  Carcinoma of overlapping sites of right breast in female, estrogen receptor positive ([place]) # Right breast cancer; Stage I ER/PR Positive; Her 2 Neu-NEG; low risk Oncotype- on anastrazole [Feb 2013]. clinicaly NED. October 2017- WNL. Breast cancer index- low risk; No benefit for extended anti-hormone therapy. The risk of recurrence over the next 5-10 years is approximately 3.6%. Discussed at length with the patient and she agrees in coming off Arimidex. STOP Arimidex.   # Osteopenia-aug 2017;  discussed re: Fosomax; potential SE- with GERD; pt has  been excising/ ca+ vit D. Wants to hold off at this time. I think this is reasonable.   #  Follow up in 6 months./ labs.  Orders Placed This Encounter  Procedures  . CBC with Differential    Standing Status:   Future    Standing Expiration Date:   04/06/2017  . Comprehensive metabolic panel    Standing Status:   Future    Standing Expiration Date:   04/06/2017      Cammie Sickle, MD 04/06/2016 9:06 AM

## 2016-04-06 NOTE — Progress Notes (Signed)
Patient here for Right breast cancer. Patient taking Arimidex as directed.  RN Chaperoned provider with Breast Exam.

## 2016-04-06 NOTE — Assessment & Plan Note (Addendum)
#   Right breast cancer; Stage I ER/PR Positive; Her 2 Neu-NEG; low risk Oncotype- on anastrazole [Feb 2013]. clinicaly NED. October 2017- WNL. Breast cancer index- low risk; No benefit for extended anti-hormone therapy. The risk of recurrence over the next 5-10 years is approximately 3.6%. Discussed at length with the patient and she agrees in coming off Arimidex. STOP Arimidex.   # Osteopenia-aug 2017;  discussed re: Fosomax; potential SE- with GERD; pt has been excising/ ca+ vit D. Wants to hold off at this time. I think this is reasonable.   #  Follow up in 6 months./ labs.

## 2016-04-25 ENCOUNTER — Ambulatory Visit: Payer: BLUE CROSS/BLUE SHIELD | Admitting: Internal Medicine

## 2016-05-02 ENCOUNTER — Telehealth: Payer: Self-pay | Admitting: Internal Medicine

## 2016-05-14 NOTE — Telephone Encounter (Signed)
x

## 2016-07-11 LAB — HM HEPATITIS C SCREENING LAB: HM Hepatitis Screen: NEGATIVE

## 2016-10-10 ENCOUNTER — Inpatient Hospital Stay (HOSPITAL_BASED_OUTPATIENT_CLINIC_OR_DEPARTMENT_OTHER): Payer: BLUE CROSS/BLUE SHIELD | Admitting: Internal Medicine

## 2016-10-10 ENCOUNTER — Inpatient Hospital Stay: Payer: BLUE CROSS/BLUE SHIELD | Attending: Internal Medicine

## 2016-10-10 VITALS — BP 132/81 | HR 60 | Temp 97.2°F | Resp 20 | Ht 66.0 in | Wt 161.0 lb

## 2016-10-10 DIAGNOSIS — M8580 Other specified disorders of bone density and structure, unspecified site: Secondary | ICD-10-CM

## 2016-10-10 DIAGNOSIS — Z806 Family history of leukemia: Secondary | ICD-10-CM | POA: Insufficient documentation

## 2016-10-10 DIAGNOSIS — Z8619 Personal history of other infectious and parasitic diseases: Secondary | ICD-10-CM

## 2016-10-10 DIAGNOSIS — Z923 Personal history of irradiation: Secondary | ICD-10-CM | POA: Insufficient documentation

## 2016-10-10 DIAGNOSIS — Z803 Family history of malignant neoplasm of breast: Secondary | ICD-10-CM

## 2016-10-10 DIAGNOSIS — Z79899 Other long term (current) drug therapy: Secondary | ICD-10-CM | POA: Insufficient documentation

## 2016-10-10 DIAGNOSIS — Z170 Estrogen receptor positive status [ER+]: Secondary | ICD-10-CM

## 2016-10-10 DIAGNOSIS — G40909 Epilepsy, unspecified, not intractable, without status epilepticus: Secondary | ICD-10-CM

## 2016-10-10 DIAGNOSIS — Z79811 Long term (current) use of aromatase inhibitors: Secondary | ICD-10-CM | POA: Diagnosis not present

## 2016-10-10 DIAGNOSIS — C50811 Malignant neoplasm of overlapping sites of right female breast: Secondary | ICD-10-CM | POA: Insufficient documentation

## 2016-10-10 LAB — COMPREHENSIVE METABOLIC PANEL
ALT: 47 U/L (ref 14–54)
ANION GAP: 8 (ref 5–15)
AST: 47 U/L — ABNORMAL HIGH (ref 15–41)
Albumin: 4.2 g/dL (ref 3.5–5.0)
Alkaline Phosphatase: 81 U/L (ref 38–126)
BILIRUBIN TOTAL: 0.7 mg/dL (ref 0.3–1.2)
BUN: 13 mg/dL (ref 6–20)
CHLORIDE: 98 mmol/L — AB (ref 101–111)
CO2: 29 mmol/L (ref 22–32)
Calcium: 9.4 mg/dL (ref 8.9–10.3)
Creatinine, Ser: 0.83 mg/dL (ref 0.44–1.00)
Glucose, Bld: 99 mg/dL (ref 65–99)
POTASSIUM: 4 mmol/L (ref 3.5–5.1)
Sodium: 135 mmol/L (ref 135–145)
TOTAL PROTEIN: 6.7 g/dL (ref 6.5–8.1)

## 2016-10-10 LAB — CBC WITH DIFFERENTIAL/PLATELET
Basophils Absolute: 0 10*3/uL (ref 0–0.1)
Basophils Relative: 1 %
EOS ABS: 0.1 10*3/uL (ref 0–0.7)
Eosinophils Relative: 2 %
HEMATOCRIT: 39.2 % (ref 35.0–47.0)
Hemoglobin: 13.7 g/dL (ref 12.0–16.0)
LYMPHS ABS: 1.2 10*3/uL (ref 1.0–3.6)
Lymphocytes Relative: 28 %
MCH: 30.6 pg (ref 26.0–34.0)
MCHC: 35 g/dL (ref 32.0–36.0)
MCV: 87.5 fL (ref 80.0–100.0)
MONOS PCT: 7 %
Monocytes Absolute: 0.3 10*3/uL (ref 0.2–0.9)
NEUTROS PCT: 62 %
Neutro Abs: 2.8 10*3/uL (ref 1.4–6.5)
Platelets: 177 10*3/uL (ref 150–440)
RBC: 4.48 MIL/uL (ref 3.80–5.20)
RDW: 13.4 % (ref 11.5–14.5)
WBC: 4.5 10*3/uL (ref 3.6–11.0)

## 2016-10-10 NOTE — Assessment & Plan Note (Addendum)
#   Right breast cancer; Stage I ER/PR Positive; Her 2 Neu-NEG; low risk Oncotype- on anastrazole [Feb 2013]. clinicaly NED. October 2017- WNL. Breast cancer index- low risk; No benefit for extended anti-hormone therapy.  STOPPED Arimidex in march 2018.   # Osteopenia-aug 2017;  discussed re: Fosomax; potential SE- with GERD; pt has been excising/ ca+ vit D. Wants to hold off at this time. I think this is reasonable.   #  Follow up in 12 months./ labs; BMD prior.

## 2016-10-10 NOTE — Progress Notes (Signed)
Patient here for follow-up for breast cancer. She has no complaints. She is no longer on Aromatase inhibiter.

## 2016-10-10 NOTE — Progress Notes (Signed)
[place] OFFICE PROGRESS NOTE  Patient Care Team: Albina Billet, MD as PCP - General (Unknown Physician Specialty) Robert Bellow, MD (General Surgery)  Cancer Staging No matching staging information was found for the patient.   Oncology History   OCT 2012- pT1c pN0 (sn) cM0 (stage I) grade 2 invasive mammary carcinoma of the right breast s/p lumpectomy and sentinel node.   Tumor size 1.6 cm, grade 2 histology, margins negative.  2 sentinel lymph nodes negative for metastasis. ER and PR positive (80%). HER-2/neu negative by FISH (HER-2/CEP17 ratio 1.08).  ONCOTYPE Dx test 12/08/10 shows Breast cancer Recurrence score of 11 (low risk) with average rate of distant recurrence of 7%.  Started aromatase inhibitor early February 2013; OCT 2017- BCI- low benefit for extended Anti-hormone [3.6% over 5-10 years]; March 2018-STOP arimidex  # AUG 2017- BMD- OSTEOPENIA     Malignant neoplasm of overlapping sites of right breast ([place]) (Resolved)    Carcinoma of overlapping sites of right breast in female, estrogen receptor positive ([place])      INTERVAL HISTORY:  Ashley Lester 61 y.o.  female pleasant patient above history of Stage I breast cancer right-sided- Is here for follow-up. Patient stopped taking anastrozole in March 2018 [5 years of therapy.].  She denies any new lumps or bumps. Denies any unusual bone pain. Denies any unusual arthritis. Denies any significant hot flashes. No chest pain or shortness of breath or cough. No nausea no vomiting.   REVIEW OF SYSTEMS:  A complete 10 point review of system is done which is negative except mentioned above/history of present illness.   PAST MEDICAL HISTORY :  Past Medical History:  Diagnosis Date  . Breast cancer ([place]) 2012   right breast, radiation  . Breast screening, unspecified   . Cellulitis and abscess of unspecified site   . Epilepsy ([place])   . Lump or mass in breast   . Malignant neoplasm of  upper-outer quadrant of female breast ([place])    diagnosed on 11-22-10; right breast cancer, invasive mammary cancer, 1.6 cm, Sentinel lymph node negative, ER/PR positive, HER-2/neu is not amplified, Oncotype Dx assay recurrent score 11 (7% chance of distant recurrence at 10 years)  . Personal history of malignant neoplasm of breast    treated with right breast wide excision with sentinel node biopsy, mastoplasty, and radiation therapy  . 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[place])   . Seizure disorder ([place]) 1964  . Special screening for malignant neoplasms, colon     PAST SURGICAL HISTORY :   Past Surgical History:  Procedure Laterality Date  . BREAST EXCISIONAL BIOPSY Right    10/2010 positive  . BREAST SURGERY Right 2012   right breast wide excision and sentinel node biopsy completed on 11-21-10 identified a 1.6 cm histologic grade 2, ER/PR positive, HER-2/neu not overexpressing carcinoma with clear margins. Sentinel nodes x2 were negative for metastatic disease. Low Oncotype DX at 7%.  . CHOLECYSTECTOMY  2005  . COLONOSCOPY  06/01/2008   Loistine Simas, M.D. Normal exam.  . GANGLION CYST EXCISION  1980's  . radiation therapy Right    breast cancer  . WISDOM TOOTH EXTRACTION  1980's    FAMILY HISTORY :   Family History  Problem Relation Age of Onset  . Leukemia Sister 26  . Breast cancer Maternal Aunt     SOCIAL HISTORY:   Social History  Substance Use Topics  . Smoking status: Never Smoker  . Smokeless tobacco: Never Used  . Alcohol use  No    ALLERGIES:  has No Known Allergies.  MEDICATIONS:  Current Outpatient Prescriptions  Medication Sig Dispense Refill  . Calcium Carb-Cholecalciferol (CALCIUM 1000 + D PO) Take 1 tablet by mouth daily.    . Inulin-Calcium-Vitamin D (FIBERCHOICE PLUS CALCIUM PO) Take 1 tablet by mouth daily.     . Nutritional Supplements (JUICE PLUS FIBRE PO) Take by mouth.    . Omega-3 Fatty Acids (FISH OIL) 1000 MG CAPS Take by mouth daily.    Marland Kitchen PREVIDENT 5000  SENSITIVE 1.1-5 % PSTE USE AS DIRECTED 1 TIME DAILY  3  . Red Yeast Rice Extract (RED YEAST RICE PO) Take by mouth 2 (two) times daily.     No current facility-administered medications for this visit.     PHYSICAL EXAMINATION: ECOG PERFORMANCE STATUS: 0 - Asymptomatic  BP 132/81   Pulse 60   Temp (!) 97.2 F (36.2 C) (Tympanic)   Resp 20   Ht '5\' 6"'  (1.676 m)   Wt 161 lb (73 kg)   BMI 25.99 kg/m   Filed Weights   10/10/16 1331  Weight: 161 lb (73 kg)    GENERAL: Well-nourished well-developed; Alert, no distress and comfortable.   Alone. EYES: no pallor or icterus OROPHARYNX: no thrush or ulceration; good dentition  NECK: supple, no masses felt LYMPH:  no palpable lymphadenopathy in the cervical, axillary or inguinal regions LUNGS: clear to auscultation and  No wheeze or crackles HEART/CVS: regular rate & rhythm and no murmurs; No lower extremity edema ABDOMEN:abdomen soft, non-tender and normal bowel sounds Musculoskeletal:no cyanosis of digits and no clubbing  PSYCH: alert & oriented x 3 with fluent speech NEURO: no focal motor/sensory deficits SKIN:  no rashes or significant lesions Right and left BREAST exam [in the presence of nurse]- scar tissue noted in the right lumpectomy region superiorly. no unusual skin changes or dominant masses felt. Surgical scars noted.    LABORATORY DATA:  I have reviewed the data as listed    Component Value Date/Time   NA 135 10/10/2016 1315   K 4.0 10/10/2016 1315   CL 98 (L) 10/10/2016 1315   CO2 29 10/10/2016 1315   GLUCOSE 99 10/10/2016 1315   BUN 13 10/10/2016 1315   CREATININE 0.83 10/10/2016 1315   CREATININE 0.94 06/10/2013 1010   CALCIUM 9.4 10/10/2016 1315   PROT 6.7 10/10/2016 1315   PROT 7.1 06/10/2013 1010   ALBUMIN 4.2 10/10/2016 1315   ALBUMIN 3.9 06/10/2013 1010   AST 47 (H) 10/10/2016 1315   AST 60 (H) 06/10/2013 1010   ALT 47 10/10/2016 1315   ALT 85 (H) 06/10/2013 1010   ALKPHOS 81 10/10/2016 1315    ALKPHOS 125 (H) 06/10/2013 1010   BILITOT 0.7 10/10/2016 1315   BILITOT 0.4 06/10/2013 1010   GFRNONAA >60 10/10/2016 1315   GFRNONAA >60 06/10/2013 1010   GFRAA >60 10/10/2016 1315   GFRAA >60 06/10/2013 1010    No results found for: SPEP, UPEP  Lab Results  Component Value Date   WBC 4.5 10/10/2016   NEUTROABS 2.8 10/10/2016   HGB 13.7 10/10/2016   HCT 39.2 10/10/2016   MCV 87.5 10/10/2016   PLT 177 10/10/2016      Chemistry      Component Value Date/Time   NA 135 10/10/2016 1315   K 4.0 10/10/2016 1315   CL 98 (L) 10/10/2016 1315   CO2 29 10/10/2016 1315   BUN 13 10/10/2016 1315   CREATININE 0.83 10/10/2016 1315  CREATININE 0.94 06/10/2013 1010      Component Value Date/Time   CALCIUM 9.4 10/10/2016 1315   ALKPHOS 81 10/10/2016 1315   ALKPHOS 125 (H) 06/10/2013 1010   AST 47 (H) 10/10/2016 1315   AST 60 (H) 06/10/2013 1010   ALT 47 10/10/2016 1315   ALT 85 (H) 06/10/2013 1010   BILITOT 0.7 10/10/2016 1315   BILITOT 0.4 06/10/2013 1010       RADIOGRAPHIC STUDIES: I have personally reviewed the radiological images as listed and agreed with the findings in the report. No results found.   ASSESSMENT & PLAN:  Carcinoma of overlapping sites of right breast in female, estrogen receptor positive ([place]) # Right breast cancer; Stage I ER/PR Positive; Her 2 Neu-NEG; low risk Oncotype- on anastrazole [Feb 2013]. clinicaly NED. October 2017- WNL. Breast cancer index- low risk; No benefit for extended anti-hormone therapy.  STOPPED Arimidex in march 2018.   # Osteopenia-aug 2017;  discussed re: Fosomax; potential SE- with GERD; pt has been excising/ ca+ vit D. Wants to hold off at this time. I think this is reasonable.   #  Follow up in 12 months./ labs; BMD prior.   Orders Placed This Encounter  Procedures  . DG Bone Density    Standing Status:   Future    Standing Expiration Date:   04/10/2018    Order Specific Question:   Reason for Exam (SYMPTOM  OR DIAGNOSIS  REQUIRED)    Answer:   History of Breast Cancer    Order Specific Question:   Is the patient pregnant?    Answer:   No    Order Specific Question:   Preferred imaging location?    Answer:   [facility]  . CBC with Differential/Platelet    Standing Status:   Future    Standing Expiration Date:   10/10/2017  . Comprehensive metabolic panel    Standing Status:   Future    Standing Expiration Date:   10/10/2017      Cammie Sickle, MD 10/26/2016 9:27 PM

## 2016-10-18 ENCOUNTER — Other Ambulatory Visit: Payer: Self-pay

## 2016-10-18 DIAGNOSIS — Z853 Personal history of malignant neoplasm of breast: Secondary | ICD-10-CM

## 2016-11-30 ENCOUNTER — Ambulatory Visit
Admission: RE | Admit: 2016-11-30 | Discharge: 2016-11-30 | Disposition: A | Discharge status: Home / Self Care | Payer: BLUE CROSS/BLUE SHIELD | Source: Ambulatory Visit | Attending: General Surgery | Admitting: General Surgery

## 2016-11-30 DIAGNOSIS — Z853 Personal history of malignant neoplasm of breast: Secondary | ICD-10-CM

## 2016-12-12 ENCOUNTER — Ambulatory Visit (INDEPENDENT_AMBULATORY_CARE_PROVIDER_SITE_OTHER): Payer: BLUE CROSS/BLUE SHIELD | Admitting: General Surgery

## 2016-12-12 ENCOUNTER — Encounter: Payer: Self-pay | Admitting: General Surgery

## 2016-12-12 VITALS — BP 130/72 | HR 72 | Resp 12 | Ht 64.0 in | Wt 164.0 lb

## 2016-12-12 DIAGNOSIS — Z853 Personal history of malignant neoplasm of breast: Secondary | ICD-10-CM

## 2016-12-12 NOTE — Progress Notes (Signed)
Patient ID: Ashley Lester, female   DOB: [birth year], 61 y.o.   MRN: [id number]  Chief Complaint  Patient presents with  . Follow-up    HPI Ashley Lester is a 61 y.o. female.  who presents for her follow up right breast cancer and a breast evaluation. The most recent mammogram was done on 11-30-16.  Patient does perform regular self breast checks and gets regular mammograms done.   No new breast issues. Occasional brief "cramp" sensation right axilla with reaching across chest.  HPI  Past Medical History:  Diagnosis Date  . Breast cancer ([place]) 2012   right breast, radiation  . Breast screening, unspecified   . Cellulitis and abscess of unspecified site   . Epilepsy ([place])   . Lump or mass in breast   . Malignant neoplasm of upper-outer quadrant of female breast ([place])    diagnosed on 11-22-10; right breast cancer, invasive mammary cancer, 1.6 cm, Sentinel lymph node negative, ER/PR positive, HER-2/neu is not amplified, Oncotype Dx assay recurrent score 11 (7% chance of distant recurrence at 10 years)  . Personal history of malignant neoplasm of breast    treated with right breast wide excision with sentinel node biopsy, mastoplasty, and radiation therapy  . 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[place])   . Seizure disorder ([place]) 1964  . Special screening for malignant neoplasms, colon     Past Surgical History:  Procedure Laterality Date  . BREAST EXCISIONAL BIOPSY Right    10/2010 positive  . BREAST LUMPECTOMY Right 2012    right breast cancer, invasive mammary cancer,  . BREAST SURGERY Right 2012   right breast wide excision and sentinel node biopsy completed on 11-21-10 identified a 1.6 cm histologic grade 2, ER/PR positive, HER-2/neu not overexpressing carcinoma with clear margins. Sentinel nodes x2 were negative for metastatic disease. Low Oncotype DX at 7%.  . CHOLECYSTECTOMY  2005  . COLONOSCOPY  06/01/2008   Loistine Simas, M.D. Normal exam.  . GANGLION CYST EXCISION  1980's   . radiation therapy Right    breast cancer  . WISDOM TOOTH EXTRACTION  1980's    Family History  Problem Relation Age of Onset  . Leukemia Sister 43  . Breast cancer Maternal Aunt   . Testicular cancer Other        nephew, age late 92's    Social History Social History   Tobacco Use  . Smoking status: Never Smoker  . Smokeless tobacco: Never Used  Substance Use Topics  . Alcohol use: No    Alcohol/week: 0.0 oz  . Drug use: No    No Known Allergies  Current Outpatient Medications  Medication Sig Dispense Refill  . Calcium Carb-Cholecalciferol (CALCIUM 1000 + D PO) Take 1 tablet 3 (three) times daily by mouth.     . Inulin-Calcium-Vitamin D (FIBERCHOICE PLUS CALCIUM PO) Take 1 tablet by mouth daily.     . Nutritional Supplements (JUICE PLUS FIBRE PO) Take by mouth.    . Omega-3 Fatty Acids (FISH OIL) 1000 MG CAPS Take 3 (three) times daily by mouth.     Marland Kitchen PREVIDENT 5000 SENSITIVE 1.1-5 % PSTE USE AS DIRECTED 1 TIME DAILY  3  . Red Yeast Rice Extract (RED YEAST RICE PO) Take by mouth 2 (two) times daily.     No current facility-administered medications for this visit.     Review of Systems Review of Systems  Constitutional: Negative.   Respiratory: Negative.   Cardiovascular: Negative.     Blood pressure 130/72, pulse  72, resp. rate 12, height '5\' 4"'  (1.626 m), weight 164 lb (74.4 kg).  Physical Exam Physical Exam  Constitutional: She is oriented to person, place, and time. She appears well-developed and well-nourished.  HENT:  Mouth/Throat: Oropharynx is clear and moist.  Eyes: Conjunctivae are normal. No scleral icterus.  Neck: Neck supple.  Cardiovascular: Normal rate, regular rhythm and normal heart sounds.  Pulmonary/Chest: Effort normal and breath sounds normal. No respiratory distress. Right breast exhibits tenderness. Right breast exhibits no inverted nipple, no mass, no nipple discharge and no skin change. Left breast exhibits no inverted nipple, no  mass, no nipple discharge, no skin change and no tenderness.    Right breast incision well healed. Mild tenderness lateral right breast  Lymphadenopathy:    She has no cervical adenopathy.    She has no axillary adenopathy.  Neurological: She is alert and oriented to person, place, and time.  Skin: Skin is warm and dry.  Psychiatric: Her behavior is normal.    Data Reviewed Bilateral diagnostic mammograms of November 30, 2016 reviewed.  Postsurgical changes.  BI-RADS-2.  Assessment    Stable breast exam.  Likely scarring accounting for localized pain with shoulder stretch.    Plan    Opportunity for physical therapy assessment discussed, declines at this time.  She will continue her antiestrogen therapy.    The patient has been asked to return to the office in one year with a bilateral diagnostic mammogram.  She plans on having a bone density scan next year with Dr Rogue Bussing. Encouraged to gradually increase weight bearing exercises and walking  HPI, Physical Exam, Assessment and Plan have been scribed under the direction and in the presence of Robert Bellow, MD. Karie Fetch, RN  I have completed the exam and reviewed the above documentation for accuracy and completeness.  I agree with the above.  Haematologist has been used and any errors in dictation or transcription are unintentional.  Hervey Ard, M.D., F.A.C.S.  Robert Bellow 12/12/2016, 10:32 AM

## 2016-12-12 NOTE — Patient Instructions (Signed)
The patient is aware to call back for any questions or concerns.  

## 2017-10-09 ENCOUNTER — Ambulatory Visit
Admission: RE | Admit: 2017-10-09 | Discharge: 2017-10-09 | Disposition: A | Discharge status: Home / Self Care | Payer: BLUE CROSS/BLUE SHIELD | Source: Ambulatory Visit | Attending: Internal Medicine | Admitting: Internal Medicine

## 2017-10-09 DIAGNOSIS — C50811 Malignant neoplasm of overlapping sites of right female breast: Secondary | ICD-10-CM | POA: Diagnosis present

## 2017-10-09 DIAGNOSIS — Z170 Estrogen receptor positive status [ER+]: Secondary | ICD-10-CM | POA: Insufficient documentation

## 2017-10-16 ENCOUNTER — Ambulatory Visit: Payer: BLUE CROSS/BLUE SHIELD | Admitting: Internal Medicine

## 2017-10-16 ENCOUNTER — Other Ambulatory Visit: Payer: BLUE CROSS/BLUE SHIELD

## 2017-10-23 ENCOUNTER — Other Ambulatory Visit: Payer: Self-pay | Admitting: *Deleted

## 2017-10-23 ENCOUNTER — Inpatient Hospital Stay: Payer: BLUE CROSS/BLUE SHIELD | Attending: Internal Medicine

## 2017-10-23 ENCOUNTER — Encounter: Payer: Self-pay | Admitting: Internal Medicine

## 2017-10-23 ENCOUNTER — Inpatient Hospital Stay (HOSPITAL_BASED_OUTPATIENT_CLINIC_OR_DEPARTMENT_OTHER): Payer: BLUE CROSS/BLUE SHIELD | Admitting: Internal Medicine

## 2017-10-23 VITALS — BP 124/78 | HR 59 | Temp 97.3°F | Resp 16 | Wt 163.3 lb

## 2017-10-23 DIAGNOSIS — Z170 Estrogen receptor positive status [ER+]: Principal | ICD-10-CM

## 2017-10-23 DIAGNOSIS — Z803 Family history of malignant neoplasm of breast: Secondary | ICD-10-CM | POA: Diagnosis not present

## 2017-10-23 DIAGNOSIS — M8580 Other specified disorders of bone density and structure, unspecified site: Secondary | ICD-10-CM | POA: Insufficient documentation

## 2017-10-23 DIAGNOSIS — Z808 Family history of malignant neoplasm of other organs or systems: Secondary | ICD-10-CM | POA: Insufficient documentation

## 2017-10-23 DIAGNOSIS — Z853 Personal history of malignant neoplasm of breast: Secondary | ICD-10-CM | POA: Insufficient documentation

## 2017-10-23 DIAGNOSIS — G40909 Epilepsy, unspecified, not intractable, without status epilepticus: Secondary | ICD-10-CM

## 2017-10-23 DIAGNOSIS — C50811 Malignant neoplasm of overlapping sites of right female breast: Secondary | ICD-10-CM

## 2017-10-23 DIAGNOSIS — Z79899 Other long term (current) drug therapy: Secondary | ICD-10-CM

## 2017-10-23 LAB — CBC WITH DIFFERENTIAL/PLATELET
Basophils Absolute: 0 10*3/uL (ref 0–0.1)
Basophils Relative: 1 %
EOS ABS: 0.1 10*3/uL (ref 0–0.7)
Eosinophils Relative: 3 %
HEMATOCRIT: 40.9 % (ref 35.0–47.0)
HEMOGLOBIN: 14.2 g/dL (ref 12.0–16.0)
LYMPHS ABS: 1.2 10*3/uL (ref 1.0–3.6)
Lymphocytes Relative: 31 %
MCH: 31.1 pg (ref 26.0–34.0)
MCHC: 34.8 g/dL (ref 32.0–36.0)
MCV: 89.5 fL (ref 80.0–100.0)
MONOS PCT: 7 %
Monocytes Absolute: 0.3 10*3/uL (ref 0.2–0.9)
NEUTROS PCT: 58 %
Neutro Abs: 2.2 10*3/uL (ref 1.4–6.5)
Platelets: 182 10*3/uL (ref 150–440)
RBC: 4.57 MIL/uL (ref 3.80–5.20)
RDW: 13.2 % (ref 11.5–14.5)
WBC: 3.8 10*3/uL (ref 3.6–11.0)

## 2017-10-23 LAB — COMPREHENSIVE METABOLIC PANEL
ALBUMIN: 4.1 g/dL (ref 3.5–5.0)
ALT: 42 U/L (ref 0–44)
AST: 43 U/L — AB (ref 15–41)
Alkaline Phosphatase: 76 U/L (ref 38–126)
Anion gap: 8 (ref 5–15)
BUN: 10 mg/dL (ref 8–23)
CHLORIDE: 103 mmol/L (ref 98–111)
CO2: 29 mmol/L (ref 22–32)
CREATININE: 1.07 mg/dL — AB (ref 0.44–1.00)
Calcium: 9.7 mg/dL (ref 8.9–10.3)
GFR calc Af Amer: >60 mL/min (ref >60)
GFR calc non Af Amer: 55 mL/min — ABNORMAL LOW (ref >60)
Glucose, Bld: 100 mg/dL — ABNORMAL HIGH (ref 70–99)
Potassium: 4 mmol/L (ref 3.5–5.1)
Sodium: 140 mmol/L (ref 135–145)
Total Bilirubin: 0.8 mg/dL (ref 0.3–1.2)
Total Protein: 6.7 g/dL (ref 6.5–8.1)

## 2017-10-23 NOTE — Assessment & Plan Note (Addendum)
#   Right breast cancer; Stage I ER/PR Positive; Her 2 Neu-NEG; low risk Oncotype- on anastrazole [stopped March 2018 x5 years].   # Clinically, NAD stable. Mammograms- oct 2018; continue follow up with Dr.Byrnett in December 2019/mammo  # Osteopenia- SEP 2019= -1.7; reviewed the results; no significant change from 2 years BMD November 2017 -1.6.  Recommend continued calcium plus vitamin D.  Continue exercise.  Hold off any pharmacotherapeutic interventions.  #  Follow up in 12 months./ labs[pt preference]

## 2017-10-23 NOTE — Progress Notes (Signed)
[facility] OFFICE PROGRESS NOTE  Patient Care Team: Albina Billet, MD as PCP - General (Unknown Physician Specialty) Robert Bellow, MD (General Surgery)  Cancer Staging No matching staging information was found for the patient.   Oncology History   OCT 2012- pT1c pN0 (sn) cM0 (stage I) grade 2 invasive mammary carcinoma of the right breast s/p lumpectomy and sentinel node.   Tumor size 1.6 cm, grade 2 histology, margins negative.  2 sentinel lymph nodes negative for metastasis. ER and PR positive (80%). HER-2/neu negative by FISH (HER-2/CEP17 ratio 1.08).  ONCOTYPE Dx test 12/08/10 shows Breast cancer Recurrence score of 11 (low risk) with average rate of distant recurrence of 7%.  Started aromatase inhibitor early February 2013; OCT 2017- BCI- low benefit for extended Anti-hormone [3.6% over 5-10 years]; March 2018-STOP arimidex  # AUG 2017- BMD- OSTEOPENIA --------------------------------------------------   DIAGNOSIS: BREAST CANCER  STAGE: I        ;GOALS: cure CURRENT/MOST RECENT THERAPY : surveillaince      Malignant neoplasm of overlapping sites of right breast ([place]) (Resolved)    Carcinoma of overlapping sites of right breast in female, estrogen receptor positive ([place])      INTERVAL HISTORY:  Ashley Lester 62 y.o.  female pleasant patient above history of Stage I breast cancer right-sided- Is here for follow-up. Patient stopped taking anastrozole in March 2018 [5 years of therapy.]  Patient appetite is good.  No weight loss.  No bone pain.  Mild pain in her left ankle.  She continues to be physically active.  Review of Systems  Constitutional: Negative for chills, diaphoresis, fever, malaise/fatigue and weight loss.  HENT: Negative for nosebleeds and sore throat.   Eyes: Negative for double vision.  Respiratory: Negative for cough, hemoptysis, sputum production, shortness of breath and wheezing.   Cardiovascular: Negative for chest  pain, palpitations, orthopnea and leg swelling.  Gastrointestinal: Negative for abdominal pain, blood in stool, constipation, diarrhea, heartburn, melena, nausea and vomiting.  Genitourinary: Negative for dysuria, frequency and urgency.  Musculoskeletal: Negative for back pain and joint pain.  Skin: Negative.  Negative for itching and rash.  Neurological: Negative for dizziness, tingling, focal weakness, weakness and headaches.  Endo/Heme/Allergies: Does not bruise/bleed easily.  Psychiatric/Behavioral: Negative for depression. The patient is not nervous/anxious and does not have insomnia.      PAST MEDICAL HISTORY :  Past Medical History:  Diagnosis Date  . Breast cancer ([place]) 2012   right breast, radiation  . Breast screening, unspecified   . Cellulitis and abscess of unspecified site   . Epilepsy ([place])   . Lump or mass in breast   . 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 Personal history of malignant neoplasm of breast    treated with right breast wide excision with sentinel node biopsy, mastoplasty, and radiation therapy  . 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[place])   . Seizure disorder ([place]) 1964  . Special screening for malignant neoplasms, colon     PAST SURGICAL HISTORY :   Past Surgical History:  Procedure Laterality Date  . BREAST EXCISIONAL BIOPSY Right    10/2010 positive  . BREAST LUMPECTOMY Right 2012    right breast cancer, invasive mammary cancer,  . BREAST SURGERY Right 2012  right breast wide excision and sentinel node biopsy completed on 11-21-10 identified a 1.6 cm histologic grade 2, ER/PR positive, HER-2/neu not overexpressing carcinoma with clear margins. Sentinel nodes x2 were negative for metastatic  disease. Low Oncotype DX at 7%.  . CHOLECYSTECTOMY  2005  . COLONOSCOPY  06/01/2008   Loistine Simas, M.D. Normal exam.  . GANGLION CYST EXCISION  1980's  . radiation therapy Right    breast cancer  . WISDOM TOOTH EXTRACTION  1980's    FAMILY HISTORY :   Family History  Problem Relation Age of Onset  . Leukemia Sister 49  . Breast cancer Maternal Aunt   . Testicular cancer Other        nephew, age late 28's    SOCIAL HISTORY:   Social History   Tobacco Use  . Smoking status: Never Smoker  . Smokeless tobacco: Never Used  Substance Use Topics  . Alcohol use: No    Alcohol/week: 0.0 standard drinks  . Drug use: No    ALLERGIES:  has No Known Allergies.  MEDICATIONS:  Current Outpatient Medications  Medication Sig Dispense Refill  . Calcium Carb-Cholecalciferol (CALCIUM 1000 + D PO) Take 1 tablet 3 (three) times daily by mouth.     . Inulin-Calcium-Vitamin D (FIBERCHOICE PLUS CALCIUM PO) Take 1 tablet by mouth daily.     . Nutritional Supplements (JUICE PLUS FIBRE PO) Take by mouth.    . Omega-3 Fatty Acids (FISH OIL) 1000 MG CAPS Take 3 (three) times daily by mouth.     Marland Kitchen PREVIDENT 5000 SENSITIVE 1.1-5 % PSTE USE AS DIRECTED 1 TIME DAILY  3  . Red Yeast Rice Extract (RED YEAST RICE PO) Take by mouth 2 (two) times daily.     No current facility-administered medications for this visit.     PHYSICAL EXAMINATION: ECOG PERFORMANCE STATUS: 0 - Asymptomatic  BP 124/78 (BP Location: Left Arm, Patient Position: Sitting)   Pulse (!) 59   Temp (!) 97.3 F (36.3 C)   Resp 16   Wt 163 lb 4.8 oz (74.1 kg)   BMI 28.03 kg/m   Filed Weights   10/23/17 1009  Weight: 163 lb 4.8 oz (74.1 kg)    Physical Exam  Constitutional: She is oriented to person, place, and time and well-developed, well-nourished, and in no distress.  HENT:  Head: Normocephalic and atraumatic.  Mouth/Throat: Oropharynx is clear and moist. No oropharyngeal exudate.  Eyes: Pupils are equal, round,  and reactive to light.  Neck: Normal range of motion. Neck supple.  Cardiovascular: Normal rate and regular rhythm.  Pulmonary/Chest: No respiratory distress. She has no wheezes.  Abdominal: Soft. Bowel sounds are normal. She exhibits no distension and no mass. There is no tenderness. There is no rebound and no guarding.  Musculoskeletal: Normal range of motion. She exhibits no edema or tenderness.  Neurological: She is alert and oriented to person, place, and time.  Skin: Skin is warm.  Psychiatric: Affect normal.      LABORATORY DATA:  I have reviewed the data as listed    Component Value Date/Time   NA 140 10/23/2017 0944   K 4.0 10/23/2017 0944   CL 103 10/23/2017 0944   CO2 29 10/23/2017 0944   GLUCOSE 100 (H) 10/23/2017 0944   BUN 10 10/23/2017 0944   CREATININE 1.07 (H) 10/23/2017 0944   CREATININE 0.94 06/10/2013 1010   CALCIUM 9.7 10/23/2017 0944   PROT 6.7 10/23/2017 0944   PROT 7.1 06/10/2013 1010   ALBUMIN  4.1 10/23/2017 0944   ALBUMIN 3.9 06/10/2013 1010   AST 43 (H) 10/23/2017 0944   AST 60 (H) 06/10/2013 1010   ALT 42 10/23/2017 0944   ALT 85 (H) 06/10/2013 1010   ALKPHOS 76 10/23/2017 0944   ALKPHOS 125 (H) 06/10/2013 1010   BILITOT 0.8 10/23/2017 0944   BILITOT 0.4 06/10/2013 1010   GFRNONAA 55 (L) 10/23/2017 0944   GFRNONAA >60 06/10/2013 1010   GFRAA >60 10/23/2017 0944   GFRAA >60 06/10/2013 1010    No results found for: SPEP, UPEP  Lab Results  Component Value Date   WBC 3.8 10/23/2017   NEUTROABS 2.2 10/23/2017   HGB 14.2 10/23/2017   HCT 40.9 10/23/2017   MCV 89.5 10/23/2017   PLT 182 10/23/2017      Chemistry      Component Value Date/Time   NA 140 10/23/2017 0944   K 4.0 10/23/2017 0944   CL 103 10/23/2017 0944   CO2 29 10/23/2017 0944   BUN 10 10/23/2017 0944   CREATININE 1.07 (H) 10/23/2017 0944   CREATININE 0.94 06/10/2013 1010      Component Value Date/Time   CALCIUM 9.7 10/23/2017 0944   ALKPHOS 76 10/23/2017 0944    ALKPHOS 125 (H) 06/10/2013 1010   AST 43 (H) 10/23/2017 0944   AST 60 (H) 06/10/2013 1010   ALT 42 10/23/2017 0944   ALT 85 (H) 06/10/2013 1010   BILITOT 0.8 10/23/2017 0944   BILITOT 0.4 06/10/2013 1010       RADIOGRAPHIC STUDIES: I have personally reviewed the radiological images as listed and agreed with the findings in the report. No results found.   ASSESSMENT & PLAN:  Carcinoma of overlapping sites of right breast in female, estrogen receptor positive ([place]) # Right breast cancer; Stage I ER/PR Positive; Her 2 Neu-NEG; low risk Oncotype- on anastrazole [stopped March 2018 x5 years].   # Clinically, NAD stable. Mammograms- oct 2018; continue follow up with Dr.Byrnett in December 2019/mammo  # Osteopenia- SEP 2019= -1.7; reviewed the results; no significant change from 2 years BMD November 2017 -1.6.  Recommend continued calcium plus vitamin D.  Continue exercise.  Hold off any pharmacotherapeutic interventions.  #  Follow up in 12 months./ labs[pt preference]  Orders Placed This Encounter  Procedures  . CBC with Differential/Platelet    Standing Status:   Future    Standing Expiration Date:   10/24/2018  . Comprehensive metabolic panel    Standing Status:   Future    Standing Expiration Date:   10/24/2018      Cammie Sickle, MD 10/23/2017 11:56 AM

## 2017-10-25 ENCOUNTER — Other Ambulatory Visit: Payer: Self-pay

## 2017-10-25 DIAGNOSIS — Z1231 Encounter for screening mammogram for malignant neoplasm of breast: Secondary | ICD-10-CM

## 2017-12-04 ENCOUNTER — Ambulatory Visit
Admission: RE | Admit: 2017-12-04 | Discharge: 2017-12-04 | Disposition: A | Discharge status: Home / Self Care | Payer: BLUE CROSS/BLUE SHIELD | Source: Ambulatory Visit | Attending: General Surgery | Admitting: General Surgery

## 2017-12-04 DIAGNOSIS — Z1231 Encounter for screening mammogram for malignant neoplasm of breast: Secondary | ICD-10-CM | POA: Diagnosis present

## 2017-12-12 ENCOUNTER — Encounter: Payer: Self-pay | Admitting: General Surgery

## 2017-12-12 ENCOUNTER — Ambulatory Visit (INDEPENDENT_AMBULATORY_CARE_PROVIDER_SITE_OTHER): Payer: BLUE CROSS/BLUE SHIELD | Admitting: General Surgery

## 2017-12-12 ENCOUNTER — Other Ambulatory Visit: Payer: Self-pay

## 2017-12-12 VITALS — BP 124/80 | HR 74 | Temp 97.7°F | Resp 12 | Ht 64.0 in | Wt 162.0 lb

## 2017-12-12 DIAGNOSIS — Z853 Personal history of malignant neoplasm of breast: Secondary | ICD-10-CM | POA: Diagnosis not present

## 2017-12-12 NOTE — Progress Notes (Signed)
Patient ID: KENZEY BIRKLAND, female   DOB: 1955-10-21, 62 y.o.   MRN: [id number]  Chief Complaint  Patient presents with  . Follow-up    HPI ALEGANDRA SOMMERS is a 62 y.o. female who presents for her follow up right breast cancer and a breast evaluation. The most recent mammogram was done on 12/04/2017.  Patient does perform regular self breast checks and gets regular mammograms done.   No new breast issues. Bone density was 10-09-17. She works for the city, [employer].  HPI  Past Medical History:  Diagnosis Date  . Breast cancer ([place]) 2012   right breast, radiation  . Breast screening, unspecified   . Cellulitis and abscess of unspecified site   . Epilepsy ([place])   . Lump or mass in breast   . Malignant neoplasm of upper-outer quadrant of female breast ([place])    diagnosed on 11-22-10; right breast cancer, invasive mammary cancer, 1.6 cm, Sentinel lymph node negative, ER/PR positive, HER-2/neu is not amplified, Oncotype Dx assay recurrent score 11 (7% chance of distant recurrence at 10 years)  . Personal history of malignant neoplasm of breast    treated with right breast wide excision with sentinel node biopsy, mastoplasty, and radiation therapy  . 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[place])   . Seizure disorder ([place]) 1964  . Special screening for malignant neoplasms, colon     Past Surgical History:  Procedure Laterality Date  . BREAST EXCISIONAL BIOPSY Right    10/2010 positive  . BREAST LUMPECTOMY Right 2012    right breast cancer, invasive mammary cancer,  . BREAST SURGERY Right 2012   right breast wide excision and sentinel node biopsy completed on 11-21-10 identified a 1.6 cm histologic grade 2, ER/PR positive, HER-2/neu not overexpressing carcinoma with clear margins. Sentinel nodes x2 were negative for metastatic disease. Low Oncotype DX at 7%.  . CHOLECYSTECTOMY  2005  . COLONOSCOPY  06/01/2008   Loistine Simas, M.D. Normal exam.  . GANGLION CYST EXCISION  1980's  . radiation  therapy Right    breast cancer  . WISDOM TOOTH EXTRACTION  1980's    Family History  Problem Relation Age of Onset  . Leukemia Sister 75  . Breast cancer Maternal Aunt   . Testicular cancer Other        nephew, age late 70's    Social History Social History   Tobacco Use  . Smoking status: Never Smoker  . Smokeless tobacco: Never Used  Substance Use Topics  . Alcohol use: No    Alcohol/week: 0.0 standard drinks  . Drug use: No    No Known Allergies  Current Outpatient Medications  Medication Sig Dispense Refill  . Calcium Carb-Cholecalciferol (CALCIUM 1000 + D PO) Take 1 tablet 3 (three) times daily by mouth.     . Inulin-Calcium-Vitamin D (FIBERCHOICE PLUS CALCIUM PO) Take 1 tablet by mouth daily.     . Nutritional Supplements (JUICE PLUS FIBRE PO) Take by mouth.    . Omega-3 Fatty Acids (FISH OIL) 1000 MG CAPS Take 3 (three) times daily by mouth.     Marland Kitchen PREVIDENT 5000 SENSITIVE 1.1-5 % PSTE USE AS DIRECTED 1 TIME DAILY  3  . Red Yeast Rice Extract (RED YEAST RICE PO) Take by mouth 2 (two) times daily.     No current facility-administered medications for this visit.     Review of Systems Review of Systems  Constitutional: Negative.   Respiratory: Negative.   Cardiovascular: Negative.     Blood pressure 124/80, pulse  74, temperature 97.7 F (36.5 C), temperature source Skin, resp. rate 12, height _0  (1.626 m), weight 162 lb (73.5 kg), SpO2 98 %.  Physical Exam Physical Exam  Constitutional: She is oriented to person, place, and time. She appears well-developed and well-nourished.  HENT:  Mouth/Throat: Oropharynx is clear and moist.  Eyes: Conjunctivae are normal. No scleral icterus.  Neck: Neck supple.  Cardiovascular: Normal rate, regular rhythm and normal heart sounds.  Pulmonary/Chest: Effort normal and breath sounds normal. Right breast exhibits no inverted nipple, no mass, no nipple discharge, no skin change and no tenderness. Left breast exhibits no  inverted nipple, no mass, no nipple discharge, no skin change and no tenderness.  Right breast lumpectomy site well healed, mild volume loss right breast.    Lymphadenopathy:    She has no cervical adenopathy.    She has no axillary adenopathy.  Neurological: She is alert and oriented to person, place, and time.  Skin: Skin is warm and dry.  Psychiatric: Her behavior is normal.    Data Reviewed Bone density dated October 09, 2017 showed osteopenia.  Bilateral screening mammograms dated December 04, 2017 were reviewed.  BI-RADS-1.  Assessment    No evidence of recurrent breast cancer.    Plan  Recommend to continue her calcium with vitamin D and weight bearing exercises. Patient will be asked to return to the office in one year with a bilateral screening mammogram.    HPI, Physical Exam, Assessment and Plan have been scribed under the direction and in the presence of Robert Bellow, MD. Karie Fetch, RN  HPI, Physical Exam, Assessment and Plan have been scribed under the direction and in the presence of Hervey Ard, MD.  Gaspar Cola, CMA  Forest Gleason Haydee Jabbour 12/16/2017, 8:18 PM

## 2017-12-12 NOTE — Patient Instructions (Signed)
Patient will be asked to return to the office in one year with a bilateral screening mammogram. The patient is aware to call back for any questions or new concerns.

## 2018-03-11 DIAGNOSIS — E782 Mixed hyperlipidemia: Secondary | ICD-10-CM | POA: Insufficient documentation

## 2018-03-11 DIAGNOSIS — E785 Hyperlipidemia, unspecified: Secondary | ICD-10-CM | POA: Insufficient documentation

## 2018-03-11 DIAGNOSIS — C50919 Malignant neoplasm of unspecified site of unspecified female breast: Secondary | ICD-10-CM | POA: Insufficient documentation

## 2018-03-12 ENCOUNTER — Ambulatory Visit (INDEPENDENT_AMBULATORY_CARE_PROVIDER_SITE_OTHER): Payer: BLUE CROSS/BLUE SHIELD | Admitting: Podiatry

## 2018-03-12 ENCOUNTER — Telehealth: Payer: Self-pay | Admitting: Podiatry

## 2018-03-12 ENCOUNTER — Ambulatory Visit (INDEPENDENT_AMBULATORY_CARE_PROVIDER_SITE_OTHER): Payer: BLUE CROSS/BLUE SHIELD

## 2018-03-12 ENCOUNTER — Encounter: Payer: Self-pay | Admitting: Podiatry

## 2018-03-12 DIAGNOSIS — M779 Enthesopathy, unspecified: Secondary | ICD-10-CM

## 2018-03-12 DIAGNOSIS — M778 Other enthesopathies, not elsewhere classified: Secondary | ICD-10-CM

## 2018-03-12 DIAGNOSIS — M76822 Posterior tibial tendinitis, left leg: Secondary | ICD-10-CM

## 2018-03-12 NOTE — Telephone Encounter (Signed)
I was seen this morning and received a shot. My foot is still numb and I was calling to see if that is normal. Please call me back at work at [phone].

## 2018-03-12 NOTE — Progress Notes (Signed)
Subjective:  Patient ID: Ashley Lester, female    DOB: Feb 07, 1955,  MRN: [id number] HPI Chief Complaint  Patient presents with  . Foot Pain    Medial foot left - sharp, stabbing x 1 year, intermittent episodes, no injury, AM pain, tried stretching  . New Patient (Initial Visit)    63 y.o. female presents with the above complaint.   ROS: Denies fever chills nausea vomiting muscle aches pains calf pain back pain chest pain shortness of breath.  Past Medical History:  Diagnosis Date  . Breast cancer ([place]) 2012   right breast, radiation  . Breast screening, unspecified   . Cellulitis and abscess of unspecified site   . Epilepsy ([place])   . Lump or mass in breast   . Malignant neoplasm of upper-outer quadrant of female breast ([place])    diagnosed on 11-22-10; right breast cancer, invasive mammary cancer, 1.6 cm, Sentinel lymph node negative, ER/PR positive, HER-2/neu is not amplified, Oncotype Dx assay recurrent score 11 (7% chance of distant recurrence at 10 years)  . Personal history of malignant neoplasm of breast    treated with right breast wide excision with sentinel node biopsy, mastoplasty, and radiation therapy  . 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 Seizure disorder ([place]) 1964  . Special screening for malignant neoplasms, colon    Past Surgical History:  Procedure Laterality Date  . BREAST EXCISIONAL BIOPSY Right    10/2010 positive  . BREAST LUMPECTOMY Right 2012    right breast cancer, invasive mammary cancer,  . BREAST SURGERY Right 2012   right breast wide excision and sentinel node biopsy completed on 11-21-10 identified a 1.6 cm histologic grade 2, ER/PR positive, HER-2/neu not overexpressing carcinoma with clear margins. Sentinel nodes x2 were negative for metastatic disease. Low Oncotype DX at 7%.  . CHOLECYSTECTOMY  2005  . COLONOSCOPY  06/01/2008   Loistine Simas, M.D. Normal exam.  . GANGLION CYST EXCISION  1980's  . radiation therapy Right    breast cancer   . WISDOM TOOTH EXTRACTION  1980's    Current Outpatient Medications:  .  Calcium Carb-Cholecalciferol (CALCIUM 1000 + D PO), Take 1 tablet 3 (three) times daily by mouth. , Disp: , Rfl:  .  Inulin-Calcium-Vitamin D (FIBERCHOICE PLUS CALCIUM PO), Take 1 tablet by mouth daily. , Disp: , Rfl:  .  Nutritional Supplements (JUICE PLUS FIBRE PO), Take by mouth., Disp: , Rfl:  .  Omega-3 Fatty Acids (FISH OIL) 1000 MG CAPS, Take 3 (three) times daily by mouth. , Disp: , Rfl:  .  PREVIDENT 5000 SENSITIVE 1.1-5 % PSTE, USE AS DIRECTED 1 TIME DAILY, Disp: , Rfl: 3 .  Red Yeast Rice Extract (RED YEAST RICE PO), Take by mouth 2 (two) times daily., Disp: , Rfl:   No Known Allergies Review of Systems Objective:  There were no vitals filed for this visit.  General: Well developed, nourished, in no acute distress, alert and oriented x3   Dermatological: Skin is warm, dry and supple bilateral. Nails x 10 are well maintained; remaining integument appears unremarkable at this time. There are no open sores, no preulcerative lesions, no rash or signs of infection present.  Vascular: Dorsalis Pedis artery and Posterior Tibial artery pedal pulses are 2/4 bilateral with immedate capillary fill time. Pedal hair growth present. No varicosities and no lower extremity edema present bilateral.   Neruologic: Grossly intact via light touch bilateral. Vibratory intact via tuning fork bilateral. Protective threshold with Semmes Wienstein monofilament intact to all  pedal sites bilateral. Patellar and Achilles deep tendon reflexes 2+ bilateral. No Babinski or clonus noted bilateral.   Musculoskeletal: No gross boney pedal deformities bilateral. No pain, crepitus, or limitation noted with foot and ankle range of motion bilateral. Muscular strength 5/5 in all groups tested bilateral.  She has pain on palpation of the posterior tibial tendon and of the plantar fascia of the left heel.  She has tenderness on inversion against  resistance she has no tenderness on palpation of the tibialis anterior tendon.   Gait: Unassisted, Nonantalgic.    Radiographs:  Radiographs taken today demonstrate an accessory navicular bone some soft tissue swelling around the posterior tibial tendon otherwise no acute findings.  Assessment & Plan:   Assessment: Plantar fasciitis with compensatory posterior tibial tendinitis left.  Plan: Discussed etiology pathology conservative versus surgical therapies.  Discussed appropriate shoe gear with her but she states that any close chills she rubs her heel and causes blisters.  She states that she does not want to decrease her activities she also does not want to take any oral medication.  I recommended ice and compression however she states that the compression hurts her foot too much she is not going to do that either she goes on to say that she would take an injection and she does not want any mobilization.  So at this point I injected 10 mg of Kenalog 5 mg Marcaine point maximal tenderness of the posterior tibial tendon and its insertion.  I instructed her to apply ice regularly and to decrease her activity level I will follow-up with her in 1 month if not improved an MRI will be her next option.      T[place], [place]

## 2018-03-12 NOTE — Telephone Encounter (Signed)
I returned patient call  She was concerned that her foot was still numb.  I informed her that sometimes it takes several hours for the numbness to wear off, but if still numb by tomorrow to call and let us know.  I informed her that if numbness stops and her foot becomes sore to use ice, elevate and try soaking in warm epson salt for relief.  She verbalized understanding

## 2018-04-09 ENCOUNTER — Ambulatory Visit: Payer: BLUE CROSS/BLUE SHIELD | Admitting: Podiatry

## 2018-08-08 DIAGNOSIS — E785 Hyperlipidemia, unspecified: Secondary | ICD-10-CM | POA: Diagnosis not present

## 2018-08-14 DIAGNOSIS — Z124 Encounter for screening for malignant neoplasm of cervix: Secondary | ICD-10-CM | POA: Diagnosis not present

## 2018-08-14 DIAGNOSIS — Z1211 Encounter for screening for malignant neoplasm of colon: Secondary | ICD-10-CM | POA: Diagnosis not present

## 2018-08-14 DIAGNOSIS — Z01419 Encounter for gynecological examination (general) (routine) without abnormal findings: Secondary | ICD-10-CM | POA: Diagnosis not present

## 2018-08-20 ENCOUNTER — Encounter: Payer: Self-pay | Admitting: General Surgery

## 2018-08-22 ENCOUNTER — Other Ambulatory Visit: Payer: Self-pay | Admitting: Obstetrics and Gynecology

## 2018-08-22 DIAGNOSIS — Z1231 Encounter for screening mammogram for malignant neoplasm of breast: Secondary | ICD-10-CM

## 2018-08-25 DIAGNOSIS — Z1211 Encounter for screening for malignant neoplasm of colon: Secondary | ICD-10-CM | POA: Diagnosis not present

## 2018-10-14 DIAGNOSIS — M62830 Muscle spasm of back: Secondary | ICD-10-CM | POA: Diagnosis not present

## 2018-10-14 DIAGNOSIS — M5414 Radiculopathy, thoracic region: Secondary | ICD-10-CM | POA: Diagnosis not present

## 2018-10-14 DIAGNOSIS — M9901 Segmental and somatic dysfunction of cervical region: Secondary | ICD-10-CM | POA: Diagnosis not present

## 2018-10-14 DIAGNOSIS — M9902 Segmental and somatic dysfunction of thoracic region: Secondary | ICD-10-CM | POA: Diagnosis not present

## 2018-10-24 ENCOUNTER — Inpatient Hospital Stay: Payer: 59 | Attending: Internal Medicine

## 2018-10-24 ENCOUNTER — Inpatient Hospital Stay (HOSPITAL_BASED_OUTPATIENT_CLINIC_OR_DEPARTMENT_OTHER): Payer: 59 | Admitting: Internal Medicine

## 2018-10-24 ENCOUNTER — Other Ambulatory Visit: Payer: Self-pay

## 2018-10-24 DIAGNOSIS — Z853 Personal history of malignant neoplasm of breast: Secondary | ICD-10-CM | POA: Diagnosis not present

## 2018-10-24 DIAGNOSIS — Z803 Family history of malignant neoplasm of breast: Secondary | ICD-10-CM | POA: Insufficient documentation

## 2018-10-24 DIAGNOSIS — C50811 Malignant neoplasm of overlapping sites of right female breast: Secondary | ICD-10-CM | POA: Diagnosis not present

## 2018-10-24 DIAGNOSIS — Z170 Estrogen receptor positive status [ER+]: Secondary | ICD-10-CM

## 2018-10-24 DIAGNOSIS — G40909 Epilepsy, unspecified, not intractable, without status epilepticus: Secondary | ICD-10-CM | POA: Insufficient documentation

## 2018-10-24 DIAGNOSIS — Z79899 Other long term (current) drug therapy: Secondary | ICD-10-CM | POA: Diagnosis not present

## 2018-10-24 DIAGNOSIS — M8580 Other specified disorders of bone density and structure, unspecified site: Secondary | ICD-10-CM | POA: Diagnosis not present

## 2018-10-24 LAB — COMPREHENSIVE METABOLIC PANEL
ALT: 27 U/L (ref 0–44)
AST: 30 U/L (ref 15–41)
Albumin: 4.3 g/dL (ref 3.5–5.0)
Alkaline Phosphatase: 63 U/L (ref 38–126)
Anion gap: 6 (ref 5–15)
BUN: 11 mg/dL (ref 8–23)
CO2: 30 mmol/L (ref 22–32)
Calcium: 9.3 mg/dL (ref 8.9–10.3)
Chloride: 104 mmol/L (ref 98–111)
Creatinine, Ser: 0.96 mg/dL (ref 0.44–1.00)
GFR calc Af Amer: >60 mL/min (ref >60)
GFR calc non Af Amer: >60 mL/min (ref >60)
Glucose, Bld: 111 mg/dL — ABNORMAL HIGH (ref 70–99)
Potassium: 3.9 mmol/L (ref 3.5–5.1)
Sodium: 140 mmol/L (ref 135–145)
Total Bilirubin: 0.8 mg/dL (ref 0.3–1.2)
Total Protein: 6.5 g/dL (ref 6.5–8.1)

## 2018-10-24 LAB — CBC WITH DIFFERENTIAL/PLATELET
Abs Immature Granulocytes: 0.01 10*3/uL (ref 0.00–0.07)
Basophils Absolute: 0.1 10*3/uL (ref 0.0–0.1)
Basophils Relative: 1 %
Eosinophils Absolute: 0.1 10*3/uL (ref 0.0–0.5)
Eosinophils Relative: 2 %
HCT: 38.2 % (ref 36.0–46.0)
Hemoglobin: 13 g/dL (ref 12.0–15.0)
Immature Granulocytes: 0 %
Lymphocytes Relative: 30 %
Lymphs Abs: 1.1 10*3/uL (ref 0.7–4.0)
MCH: 30.3 pg (ref 26.0–34.0)
MCHC: 34 g/dL (ref 30.0–36.0)
MCV: 89 fL (ref 80.0–100.0)
Monocytes Absolute: 0.3 10*3/uL (ref 0.1–1.0)
Monocytes Relative: 8 %
Neutro Abs: 2.1 10*3/uL (ref 1.7–7.7)
Neutrophils Relative %: 59 %
Platelets: 180 10*3/uL (ref 150–400)
RBC: 4.29 MIL/uL (ref 3.87–5.11)
RDW: 13 % (ref 11.5–15.5)
WBC: 3.6 10*3/uL — ABNORMAL LOW (ref 4.0–10.5)
nRBC: 0 % (ref 0.0–0.2)

## 2018-10-24 NOTE — Progress Notes (Signed)
[facility] OFFICE PROGRESS NOTE  Patient Care Team: Albina Billet, MD as PCP - General (Unknown Physician Specialty) Robert Bellow, MD (General Surgery)  Cancer Staging No matching staging information was found for the patient.   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   Tumor size 1.6 cm, grade 2 histology, margins negative.  2 sentinel lymph nodes negative for metastasis. ER and PR positive (80%). HER-2/neu negative by FISH (HER-2/CEP17 ratio 1.08).  ONCOTYPE Dx test 12/08/10 shows Breast cancer Recurrence score of 11 (low risk) with average rate of distant recurrence of 7%.  Started aromatase inhibitor early February 2013; OCT 2017- BCI- low benefit for extended Anti-hormone [3.6% over 5-10 years]; March 2018-STOP arimidex  # AUG 2017- BMD- OSTEOPENIA --------------------------------------------------   DIAGNOSIS: BREAST CANCER  STAGE: I        ;GOALS: cure CURRENT/MOST RECENT THERAPY : surveillaince    Malignant neoplasm of overlapping sites of right breast ([place]) (Resolved)  Carcinoma of overlapping sites of right breast in female, estrogen receptor positive ([place])      INTERVAL HISTORY:  Ashley Lester 63 y.o.  female pleasant patient above history of Stage I breast cancer right-sided- Is here for follow-up/currently on surveillance.  Appetite is good.  No weight loss but no bone pain.  No nausea no vomiting.  She continues to exercise.   Review of Systems  Constitutional: Negative for chills, diaphoresis, fever, malaise/fatigue and weight loss.  HENT: Negative for nosebleeds and sore throat.   Eyes: Negative for double vision.  Respiratory: Negative for cough, hemoptysis, sputum production, shortness of breath and wheezing.   Cardiovascular: Negative for chest pain, palpitations, orthopnea and leg swelling.   Gastrointestinal: Negative for abdominal pain, blood in stool, constipation, diarrhea, heartburn, melena, nausea and vomiting.  Genitourinary: Negative for dysuria, frequency and urgency.  Musculoskeletal: Negative for back pain and joint pain.  Skin: Negative.  Negative for itching and rash.  Neurological: Negative for dizziness, tingling, focal weakness, weakness and headaches.  Endo/Heme/Allergies: Does not bruise/bleed easily.  Psychiatric/Behavioral: Negative for depression. The patient is not nervous/anxious and does not have insomnia.      PAST MEDICAL HISTORY :  Past Medical History:  Diagnosis Date  . Breast cancer ([place]) 2012   right breast, radiation  . Breast screening, unspecified   . Cellulitis and abscess of unspecified site   . Epilepsy ([place])   . Lump or mass in breast   . 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 Personal history of malignant neoplasm of breast    treated with right breast wide excision with sentinel node biopsy, mastoplasty, and radiation therapy  . 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 Seizure disorder ([place]) 1964  . Special screening for malignant neoplasms, colon     PAST SURGICAL HISTORY :   Past Surgical History:  Procedure Laterality Date  . BREAST EXCISIONAL BIOPSY Right    10/2010 positive  . BREAST LUMPECTOMY Right 2012    right breast cancer, invasive mammary cancer,  . BREAST SURGERY Right 2012   right breast wide excision and sentinel node biopsy completed on 11-21-10 identified a 1.6 cm  histologic grade 2, ER/PR positive, HER-2/neu not overexpressing carcinoma with clear margins. Sentinel nodes x2 were negative for metastatic disease. Low Oncotype DX at 7%.  . CHOLECYSTECTOMY   2005  . COLONOSCOPY  06/01/2008   Loistine Simas, M.D. Normal exam.  . GANGLION CYST EXCISION  1980's  . radiation therapy Right    breast cancer  . WISDOM TOOTH EXTRACTION  1980's    FAMILY HISTORY :   Family History  Problem Relation Age of Onset  . Leukemia Sister 31  . Breast cancer Maternal Aunt   . Testicular cancer Other        nephew, age late 54's    SOCIAL HISTORY:   Social History   Tobacco Use  . Smoking status: Never Smoker  . Smokeless tobacco: Never Used  Substance Use Topics  . Alcohol use: No    Alcohol/week: 0.0 standard drinks  . Drug use: No    ALLERGIES:  has No Known Allergies.  MEDICATIONS:  Current Outpatient Medications  Medication Sig Dispense Refill  . Calcium Carb-Cholecalciferol (CALCIUM 1000 + D PO) Take 1 tablet 3 (three) times daily by mouth.     . Inulin-Calcium-Vitamin D (FIBERCHOICE PLUS CALCIUM PO) Take 1 tablet by mouth daily.     . Nutritional Supplements (JUICE PLUS FIBRE PO) Take by mouth.    . Omega-3 Fatty Acids (FISH OIL) 1000 MG CAPS Take 3 (three) times daily by mouth.     Marland Kitchen PREVIDENT 5000 SENSITIVE 1.1-5 % PSTE USE AS DIRECTED 1 TIME DAILY  3  . Red Yeast Rice Extract (RED YEAST RICE PO) Take by mouth 2 (two) times daily.     No current facility-administered medications for this visit.     PHYSICAL EXAMINATION: ECOG PERFORMANCE STATUS: 0 - Asymptomatic  BP 111/77 (BP Location: Left Arm, Patient Position: Sitting, Cuff Size: Normal)   Pulse 65   Temp 98.1 F (36.7 C) (Tympanic)   Resp 16   Wt 155 lb (70.3 kg)   BMI 26.61 kg/m   Filed Weights   10/24/18 1031  Weight: 155 lb (70.3 kg)    Physical Exam  Constitutional: She is oriented to person, place, and time and well-developed, well-nourished, and in no distress.  HENT:  Head: Normocephalic and atraumatic.  Mouth/Throat: Oropharynx is clear and moist. No oropharyngeal exudate.  Eyes: Pupils are equal, round, and reactive to light.  Neck: Normal range of  motion. Neck supple.  Cardiovascular: Normal rate and regular rhythm.  Pulmonary/Chest: No respiratory distress. She has no wheezes.  Abdominal: Soft. Bowel sounds are normal. She exhibits no distension and no mass. There is no abdominal tenderness. There is no rebound and no guarding.  Musculoskeletal: Normal range of motion.        General: No tenderness or edema.  Neurological: She is alert and oriented to person, place, and time.  Skin: Skin is warm.  Psychiatric: Affect normal.      LABORATORY DATA:  I have reviewed the data as listed    Component Value Date/Time   NA 140 10/24/2018 1004   K 3.9 10/24/2018 1004   CL 104 10/24/2018 1004   CO2 30 10/24/2018 1004   GLUCOSE 111 (H) 10/24/2018 1004   BUN 11 10/24/2018 1004   CREATININE 0.96 10/24/2018 1004   CREATININE 0.94 06/10/2013 1010   CALCIUM 9.3 10/24/2018 1004   PROT 6.5 10/24/2018 1004   PROT 7.1 06/10/2013 1010   ALBUMIN 4.3 10/24/2018 1004   ALBUMIN 3.9 06/10/2013 1010  AST 30 10/24/2018 1004   AST 60 (H) 06/10/2013 1010   ALT 27 10/24/2018 1004   ALT 85 (H) 06/10/2013 1010   ALKPHOS 63 10/24/2018 1004   ALKPHOS 125 (H) 06/10/2013 1010   BILITOT 0.8 10/24/2018 1004   BILITOT 0.4 06/10/2013 1010   GFRNONAA >60 10/24/2018 1004   GFRNONAA >60 06/10/2013 1010   GFRAA >60 10/24/2018 1004   GFRAA >60 06/10/2013 1010    No results found for: SPEP, UPEP  Lab Results  Component Value Date   WBC 3.6 (L) 10/24/2018   NEUTROABS 2.1 10/24/2018   HGB 13.0 10/24/2018   HCT 38.2 10/24/2018   MCV 89.0 10/24/2018   PLT 180 10/24/2018      Chemistry      Component Value Date/Time   NA 140 10/24/2018 1004   K 3.9 10/24/2018 1004   CL 104 10/24/2018 1004   CO2 30 10/24/2018 1004   BUN 11 10/24/2018 1004   CREATININE 0.96 10/24/2018 1004   CREATININE 0.94 06/10/2013 1010      Component Value Date/Time   CALCIUM 9.3 10/24/2018 1004   ALKPHOS 63 10/24/2018 1004   ALKPHOS 125 (H) 06/10/2013 1010   AST 30  10/24/2018 1004   AST 60 (H) 06/10/2013 1010   ALT 27 10/24/2018 1004   ALT 85 (H) 06/10/2013 1010   BILITOT 0.8 10/24/2018 1004   BILITOT 0.4 06/10/2013 1010       RADIOGRAPHIC STUDIES: I have personally reviewed the radiological images as listed and agreed with the findings in the report. No results found.   ASSESSMENT & PLAN:  Carcinoma of overlapping sites of right breast in female, estrogen receptor positive ([place]) # Right breast cancer; Stage I ER/PR Positive; Her 2 Neu-NEG; low risk Oncotype- on anastrazole [stopped March 2018 x5 years].   #Clinically NED. Mammograms- oct 2019; continue follow up with Dr. Ouida Sills in nov 2020/mammo; breast exam with gynecology.  # Osteopenia- SEP 2019= -1.7; reviewed the results.  Continue calcium plus vitamin D.   #  DISPOSITION: # Follow up in 12 months./ labs[pt preference]  No orders of the defined types were placed in this encounter.     Cammie Sickle, MD 10/24/2018 12:08 PM

## 2018-10-24 NOTE — Assessment & Plan Note (Addendum)
#   Right breast cancer; Stage I ER/PR Positive; Her 2 Neu-NEG; low risk Oncotype- on anastrazole [stopped March 2018 x5 years].   #Clinically NED. Mammograms- oct 2019; continue follow up with Dr. Ouida Sills in nov 2020/mammo; breast exam with gynecology.  # Osteopenia- SEP 2019= -1.7; reviewed the results.  Continue calcium plus vitamin D.   #  DISPOSITION: # Follow up in 12 months./ labs[pt preference]

## 2018-10-30 ENCOUNTER — Ambulatory Visit: Payer: Self-pay

## 2018-10-30 DIAGNOSIS — Z23 Encounter for immunization: Secondary | ICD-10-CM

## 2018-11-11 DIAGNOSIS — M9901 Segmental and somatic dysfunction of cervical region: Secondary | ICD-10-CM | POA: Diagnosis not present

## 2018-11-11 DIAGNOSIS — M9902 Segmental and somatic dysfunction of thoracic region: Secondary | ICD-10-CM | POA: Diagnosis not present

## 2018-11-11 DIAGNOSIS — M62830 Muscle spasm of back: Secondary | ICD-10-CM | POA: Diagnosis not present

## 2018-11-11 DIAGNOSIS — M5414 Radiculopathy, thoracic region: Secondary | ICD-10-CM | POA: Diagnosis not present

## 2018-12-08 ENCOUNTER — Ambulatory Visit
Admission: RE | Admit: 2018-12-08 | Discharge: 2018-12-08 | Disposition: A | Discharge status: Home / Self Care | Payer: 59 | Source: Ambulatory Visit | Attending: Obstetrics and Gynecology | Admitting: Obstetrics and Gynecology

## 2018-12-08 DIAGNOSIS — Z1231 Encounter for screening mammogram for malignant neoplasm of breast: Secondary | ICD-10-CM | POA: Insufficient documentation

## 2018-12-09 DIAGNOSIS — M62830 Muscle spasm of back: Secondary | ICD-10-CM | POA: Diagnosis not present

## 2018-12-09 DIAGNOSIS — M9902 Segmental and somatic dysfunction of thoracic region: Secondary | ICD-10-CM | POA: Diagnosis not present

## 2018-12-09 DIAGNOSIS — M9901 Segmental and somatic dysfunction of cervical region: Secondary | ICD-10-CM | POA: Diagnosis not present

## 2018-12-09 DIAGNOSIS — M5414 Radiculopathy, thoracic region: Secondary | ICD-10-CM | POA: Diagnosis not present

## 2018-12-19 ENCOUNTER — Ambulatory Visit: Payer: BLUE CROSS/BLUE SHIELD | Admitting: Surgery

## 2019-01-06 DIAGNOSIS — M9902 Segmental and somatic dysfunction of thoracic region: Secondary | ICD-10-CM | POA: Diagnosis not present

## 2019-01-06 DIAGNOSIS — M62830 Muscle spasm of back: Secondary | ICD-10-CM | POA: Diagnosis not present

## 2019-01-06 DIAGNOSIS — M5414 Radiculopathy, thoracic region: Secondary | ICD-10-CM | POA: Diagnosis not present

## 2019-01-06 DIAGNOSIS — M9901 Segmental and somatic dysfunction of cervical region: Secondary | ICD-10-CM | POA: Diagnosis not present

## 2019-01-16 DIAGNOSIS — E785 Hyperlipidemia, unspecified: Secondary | ICD-10-CM | POA: Diagnosis not present

## 2019-01-20 DIAGNOSIS — E785 Hyperlipidemia, unspecified: Secondary | ICD-10-CM | POA: Diagnosis not present

## 2019-04-07 DIAGNOSIS — M9901 Segmental and somatic dysfunction of cervical region: Secondary | ICD-10-CM | POA: Diagnosis not present

## 2019-04-07 DIAGNOSIS — M62830 Muscle spasm of back: Secondary | ICD-10-CM | POA: Diagnosis not present

## 2019-04-07 DIAGNOSIS — M5414 Radiculopathy, thoracic region: Secondary | ICD-10-CM | POA: Diagnosis not present

## 2019-04-07 DIAGNOSIS — M9902 Segmental and somatic dysfunction of thoracic region: Secondary | ICD-10-CM | POA: Diagnosis not present

## 2019-05-05 DIAGNOSIS — M5414 Radiculopathy, thoracic region: Secondary | ICD-10-CM | POA: Diagnosis not present

## 2019-05-05 DIAGNOSIS — M9902 Segmental and somatic dysfunction of thoracic region: Secondary | ICD-10-CM | POA: Diagnosis not present

## 2019-05-05 DIAGNOSIS — M9901 Segmental and somatic dysfunction of cervical region: Secondary | ICD-10-CM | POA: Diagnosis not present

## 2019-05-05 DIAGNOSIS — M62830 Muscle spasm of back: Secondary | ICD-10-CM | POA: Diagnosis not present

## 2019-05-14 ENCOUNTER — Other Ambulatory Visit: Payer: Self-pay

## 2019-05-14 ENCOUNTER — Other Ambulatory Visit
Admission: RE | Admit: 2019-05-14 | Discharge: 2019-05-14 | Disposition: A | Discharge status: Home / Self Care | Payer: 59 | Source: Ambulatory Visit | Attending: Internal Medicine | Admitting: Internal Medicine

## 2019-05-14 DIAGNOSIS — Z01812 Encounter for preprocedural laboratory examination: Secondary | ICD-10-CM | POA: Diagnosis not present

## 2019-05-14 DIAGNOSIS — Z20822 Contact with and (suspected) exposure to covid-19: Secondary | ICD-10-CM | POA: Insufficient documentation

## 2019-05-14 LAB — SARS CORONAVIRUS 2 (TAT 6-24 HRS): SARS Coronavirus 2: NEGATIVE

## 2019-05-15 ENCOUNTER — Encounter: Payer: Self-pay | Admitting: Internal Medicine

## 2019-05-18 ENCOUNTER — Other Ambulatory Visit: Payer: Self-pay

## 2019-05-18 ENCOUNTER — Ambulatory Visit: Payer: 59 | Admitting: Certified Registered Nurse Anesthetist

## 2019-05-18 ENCOUNTER — Encounter: Payer: Self-pay | Admitting: Internal Medicine

## 2019-05-18 ENCOUNTER — Encounter
Admission: RE | Disposition: A | Discharge status: Home / Self Care | Payer: Self-pay | Source: Home / Self Care | Attending: Internal Medicine

## 2019-05-18 ENCOUNTER — Ambulatory Visit
Admission: RE | Admit: 2019-05-18 | Discharge: 2019-05-18 | Disposition: A | Discharge status: Home / Self Care | Payer: 59 | Attending: Internal Medicine | Admitting: Internal Medicine

## 2019-05-18 DIAGNOSIS — Z79899 Other long term (current) drug therapy: Secondary | ICD-10-CM | POA: Diagnosis not present

## 2019-05-18 DIAGNOSIS — Z853 Personal history of malignant neoplasm of breast: Secondary | ICD-10-CM | POA: Insufficient documentation

## 2019-05-18 DIAGNOSIS — Z923 Personal history of irradiation: Secondary | ICD-10-CM | POA: Diagnosis not present

## 2019-05-18 DIAGNOSIS — Z1211 Encounter for screening for malignant neoplasm of colon: Secondary | ICD-10-CM | POA: Insufficient documentation

## 2019-05-18 HISTORY — PX: COLONOSCOPY WITH PROPOFOL: SHX5780

## 2019-05-18 SURGERY — COLONOSCOPY WITH PROPOFOL
Anesthesia: General

## 2019-05-18 MED ORDER — EPHEDRINE SULFATE 50 MG/ML IJ SOLN
INTRAMUSCULAR | Status: DC | PRN
  Administered 2019-05-18: 10 mg via INTRAVENOUS
  Administered 2019-05-18: 5 mg via INTRAVENOUS

## 2019-05-18 MED ORDER — PROPOFOL 500 MG/50ML IV EMUL
INTRAVENOUS | Status: DC | PRN
  Administered 2019-05-18: 150 ug/kg/min via INTRAVENOUS

## 2019-05-18 MED ORDER — LIDOCAINE HCL (CARDIAC) PF 100 MG/5ML IV SOSY
PREFILLED_SYRINGE | INTRAVENOUS | Status: DC | PRN
  Administered 2019-05-18: 40 mg via INTRATRACHEAL

## 2019-05-18 MED ORDER — SODIUM CHLORIDE 0.9 % IV SOLN: INTRAVENOUS | Status: DC | PRN

## 2019-05-18 MED ORDER — PROPOFOL 500 MG/50ML IV EMUL
INTRAVENOUS | Status: AC
  Filled 2019-05-18: qty 50

## 2019-05-18 MED ORDER — SODIUM CHLORIDE 0.9 % IV SOLN: INTRAVENOUS | Status: DC

## 2019-05-18 MED ORDER — PROPOFOL 10 MG/ML IV BOLUS
INTRAVENOUS | Status: AC
  Filled 2019-05-18: qty 20

## 2019-05-18 MED ORDER — LIDOCAINE HCL (PF) 2 % IJ SOLN
INTRAMUSCULAR | Status: AC
  Filled 2019-05-18: qty 20

## 2019-05-18 MED ORDER — PROPOFOL 10 MG/ML IV BOLUS
INTRAVENOUS | Status: DC | PRN
  Administered 2019-05-18: 40 mg via INTRAVENOUS
  Administered 2019-05-18: 30 mg via INTRAVENOUS

## 2019-05-18 MED ORDER — EPHEDRINE 5 MG/ML INJ
INTRAVENOUS | Status: AC
  Filled 2019-05-18: qty 10

## 2019-05-18 NOTE — Interval H&P Note (Signed)
History and Physical Interval Note:  05/18/2019 9:26 AM  Ashley Lester  has presented today for surgery, with the diagnosis of SCREENING COLONOSCOPY.  The various methods of treatment have been discussed with the patient and family. After consideration of risks, benefits and other options for treatment, the patient has consented to  Procedure(s): COLONOSCOPY WITH PROPOFOL (N/A) as a surgical intervention.  The patient's history has been reviewed, patient examined, no change in status, stable for surgery.  I have reviewed the patient's chart and labs.  Questions were answered to the patient's satisfaction.     [place], [place]

## 2019-05-18 NOTE — Anesthesia Postprocedure Evaluation (Signed)
Anesthesia Post Note  Patient: Ashley Lester  Procedure(s) Performed: COLONOSCOPY WITH PROPOFOL (N/A )  Patient location during evaluation: Endoscopy Anesthesia Type: General Level of consciousness: awake and alert and oriented Pain management: pain level controlled Vital Signs Assessment: post-procedure vital signs reviewed and stable Respiratory status: spontaneous breathing Cardiovascular status: blood pressure returned to baseline Anesthetic complications: no     Last Vitals:  Vitals:   05/18/19 1011 05/18/19 1021  BP: 109/61 140/63  Pulse: 78 65  Resp: 16 16  Temp:    SpO2: 100% 100%    Last Pain:  Vitals:   05/18/19 1021  TempSrc:   PainSc: 0-No pain                 Jasani Dolney

## 2019-05-18 NOTE — H&P (Signed)
Outpatient short stay form Pre-procedure 05/18/2019 9:26 AM Teodoro K. Alice Reichert, M.D.  Primary Physician: Benita Stabile, M.D.  Reason for visit:  Colon cancer screening  History of present illness:  Patient presents for colonoscopy for colon cancer screening. The patient denies complaints of abdominal pain, significant change in bowel habits, or rectal bleeding.      Current Facility-Administered Medications:  .  0.9 %  sodium chloride infusion, , Intravenous, Continuous, [place], Benay Pike, MD  Medications Prior to Admission  Medication Sig Dispense Refill Last Dose  . calcium acetate (PHOSLO) 667 MG capsule Take 667 mg by mouth 3 (three) times daily with meals.   Past Week at Unknown time  . Calcium Carb-Cholecalciferol (CALCIUM 1000 + D PO) Take 1 tablet 3 (three) times daily by mouth.    Past Week at Unknown time  . Omega-3 Fatty Acids (FISH OIL) 1000 MG CAPS Take 3 (three) times daily by mouth.    Past Week at Unknown time  . Inulin-Calcium-Vitamin D (FIBERCHOICE PLUS CALCIUM PO) Take 1 tablet by mouth daily.      . Nutritional Supplements (JUICE PLUS FIBRE PO) Take by mouth.     Marland Kitchen PREVIDENT 5000 SENSITIVE 1.1-5 % PSTE USE AS DIRECTED 1 TIME DAILY  3   . Red Yeast Rice Extract (RED YEAST RICE PO) Take by mouth 2 (two) times daily.        Not on File   Past Medical History:  Diagnosis Date  . Breast cancer ([place]) 2012   right breast, radiation  . Breast screening, unspecified   . Cellulitis and abscess of unspecified site   . Epilepsy ([place])   . Lump or mass in breast   . 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 Personal history of malignant neoplasm of breast    treated with right breast wide excision with sentinel  node biopsy, mastoplasty, and radiation therapy  . Personal history of radiation therapy 2012   right breast ca  . 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 Seizure disorder ([place]) 1964  . Special screening for malignant neoplasms, colon     Review of systems:  Otherwise negative.    Physical Exam  Gen: Alert, oriented. Appears stated age.  HEENT: [place]/AT. PERRLA. Lungs: CTA, no wheezes. CV: RR nl S1, S2. Abd: soft, benign, no masses. BS+ Ext: No edema. Pulses 2+    Planned procedures: Proceed with colonoscopy. The patient understands the nature of the planned procedure, indications, risks, alternatives and potential complications including but not limited to bleeding, infection, perforation, damage to internal organs and possible oversedation/side effects from anesthesia. The patient agrees and gives consent to proceed.  Please refer to procedure notes for findings, recommendations and patient disposition/instructions.     Teodoro K. Alice Reichert, M.D. Gastroenterology 05/18/2019  9:26 AM

## 2019-05-18 NOTE — Op Note (Signed)
[facility] Gastroenterology Patient Name: Ashley Lester Procedure Date: 05/18/2019 9:29 AM MRN: [id number] Account #: [id number] Date of Birth: 1955/03/01 Admit Type: Outpatient Age: 64 Room: [facility] ENDO ROOM 3 Gender: Female Note Status: Finalized Procedure:             Colonoscopy Indications:           Screening for colorectal malignant neoplasm Providers:             Benay Pike. Alice Reichert MD, MD Referring MD:          Leona Carry. Hall Busing, MD (Referring MD) Medicines:             Propofol per Anesthesia Complications:         No immediate complications. Procedure:             Pre-Anesthesia Assessment:                        - The risks and benefits of the procedure and the                         sedation options and risks were discussed with the                         patient. All questions were answered and informed                         consent was obtained.                        - Patient identification and proposed procedure were                         verified prior to the procedure by the nurse. The                         procedure was verified in the procedure room.                        - ASA Grade Assessment: III - A patient with severe                         systemic disease.                        - After reviewing the risks and benefits, the patient                         was deemed in satisfactory condition to undergo the                         procedure.                        After obtaining informed consent, the colonoscope was                         passed under direct vision. Throughout the procedure,                         the patient's blood pressure,  pulse, and oxygen                         saturations were monitored continuously. The                         Colonoscope was introduced through the anus and                         advanced to the the cecum, identified by appendiceal                         orifice and ileocecal  valve. The colonoscopy was                         performed without difficulty. The patient tolerated                         the procedure well. The quality of the bowel                         preparation was good. The ileocecal valve, appendiceal                         orifice, and rectum were photographed. Findings:      The perianal and digital rectal examinations were normal. Pertinent       negatives include normal sphincter tone and no palpable rectal lesions.      The entire examined colon appeared normal on direct and retroflexion       views. Impression:            - The entire examined colon is normal on direct and                         retroflexion views.                        - No specimens collected. Recommendation:        - Patient has a contact number available for                         emergencies. The signs and symptoms of potential                         delayed complications were discussed with the patient.                         Return to normal activities tomorrow. Written                         discharge instructions were provided to the patient.                        - Resume previous diet.                        - Continue present medications.                        - Repeat colonoscopy in 10 years for  screening                         purposes.                        - Return to GI office PRN.                        - The findings and recommendations were discussed with                         the patient. Procedure Code(s):     --- Professional ---                        [id number], Colorectal cancer screening; colonoscopy on                         individual not meeting criteria for high risk Diagnosis Code(s):     --- Professional ---                        Z12.11, Encounter for screening for malignant neoplasm                         of colon CPT copyright 2019 [employer]. All rights reserved. The codes documented in this report  are preliminary and upon coder review may  be revised to meet current compliance requirements. Efrain Sella MD, MD 05/18/2019 9:59:04 AM This report has been signed electronically. Number of Addenda: 0 Note Initiated On: 05/18/2019 9:29 AM Scope Withdrawal Time: 0 hours 6 minutes 11 seconds  Total Procedure Duration: 0 hours 11 minutes 55 seconds  Estimated Blood Loss:  Estimated blood loss: none.      [facility]

## 2019-05-18 NOTE — Anesthesia Preprocedure Evaluation (Signed)
Anesthesia Evaluation  Patient identified by MRN, date of birth, ID band Patient awake    Reviewed: Allergy & Precautions, NPO status , Patient's Chart, lab work & pertinent test results  Airway Mallampati: II       Dental   Pulmonary neg pulmonary ROS,    Pulmonary exam normal        Cardiovascular negative cardio ROS Normal cardiovascular exam     Neuro/Psych Seizures -,  negative psych ROS   GI/Hepatic negative GI ROS, Neg liver ROS,   Endo/Other  negative endocrine ROS  Renal/GU negative Renal ROS     Musculoskeletal negative musculoskeletal ROS (+)   Abdominal Normal abdominal exam  (+)   Peds negative pediatric ROS (+)  Hematology negative hematology ROS (+)   Anesthesia Other Findings Past Medical History: 2012: Breast cancer ([place])     Comment:  right breast, radiation No date: Breast screening, unspecified No date: Cellulitis and abscess of unspecified site No date: Epilepsy ([place]) No date: Lump or mass in breast No date: Malignant neoplasm of upper-outer quadrant of female breast  ([place])     Comment:  diagnosed on 11-22-10; right breast cancer, invasive               mammary cancer, 1.6 cm, Sentinel lymph node negative,               ER/PR positive, HER-2/neu is not amplified, Oncotype Dx               assay recurrent score 11 (7% chance of distant recurrence              at 10 years) No date: Personal history of malignant neoplasm of breast     Comment:  treated with right breast wide excision with sentinel               node biopsy, mastoplasty, and radiation therapy 2012: Personal history of radiation therapy     Comment:  right breast ca No date: 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[place]) 1964: Seizure disorder ([place]) No date: Special screening for malignant neoplasms, colon  Reproductive/Obstetrics                             Anesthesia Physical Anesthesia Plan  ASA:  II  Anesthesia Plan: General   Post-op Pain Management:    Induction: Intravenous  PONV Risk Score and Plan:   Airway Management Planned: Nasal Cannula  Additional Equipment:   Intra-op Plan:   Post-operative Plan:   Informed Consent: I have reviewed the patients History and Physical, chart, labs and discussed the procedure including the risks, benefits and alternatives for the proposed anesthesia with the patient or authorized representative who has indicated his/her understanding and acceptance.     Dental advisory given  Plan Discussed with: CRNA and Surgeon  Anesthesia Plan Comments:         Anesthesia Quick Evaluation

## 2019-05-18 NOTE — Transfer of Care (Signed)
Immediate Anesthesia Transfer of Care Note  Patient: Ashley Lester  Procedure(s) Performed: COLONOSCOPY WITH PROPOFOL (N/A )  Patient Location: PACU  Anesthesia Type:General  Level of Consciousness: awake, alert , oriented and patient cooperative  Airway & Oxygen Therapy: Patient Spontanous Breathing  Post-op Assessment: Report given to RN and Post -op Vital signs reviewed and stable  Post vital signs: Reviewed and stable  Last Vitals:  Vitals Value Taken Time  BP 94/61 05/18/19 1001  Temp 36.7 C 05/18/19 1001  Pulse 85 05/18/19 1003  Resp 19 05/18/19 1003  SpO2 100 % 05/18/19 1003  Vitals shown include unvalidated device data.  Last Pain:  Vitals:   05/18/19 1001  TempSrc:   PainSc: 0-No pain         Complications: No apparent anesthesia complications

## 2019-05-20 ENCOUNTER — Encounter: Payer: Self-pay | Admitting: *Deleted

## 2019-06-02 DIAGNOSIS — M5414 Radiculopathy, thoracic region: Secondary | ICD-10-CM | POA: Diagnosis not present

## 2019-06-02 DIAGNOSIS — M62830 Muscle spasm of back: Secondary | ICD-10-CM | POA: Diagnosis not present

## 2019-06-02 DIAGNOSIS — M9902 Segmental and somatic dysfunction of thoracic region: Secondary | ICD-10-CM | POA: Diagnosis not present

## 2019-06-02 DIAGNOSIS — M9901 Segmental and somatic dysfunction of cervical region: Secondary | ICD-10-CM | POA: Diagnosis not present

## 2019-06-10 DIAGNOSIS — H524 Presbyopia: Secondary | ICD-10-CM | POA: Diagnosis not present

## 2019-07-07 DIAGNOSIS — M9901 Segmental and somatic dysfunction of cervical region: Secondary | ICD-10-CM | POA: Diagnosis not present

## 2019-07-07 DIAGNOSIS — M5414 Radiculopathy, thoracic region: Secondary | ICD-10-CM | POA: Diagnosis not present

## 2019-07-07 DIAGNOSIS — M9902 Segmental and somatic dysfunction of thoracic region: Secondary | ICD-10-CM | POA: Diagnosis not present

## 2019-07-07 DIAGNOSIS — M62830 Muscle spasm of back: Secondary | ICD-10-CM | POA: Diagnosis not present

## 2019-07-21 DIAGNOSIS — M9901 Segmental and somatic dysfunction of cervical region: Secondary | ICD-10-CM | POA: Diagnosis not present

## 2019-07-21 DIAGNOSIS — M62830 Muscle spasm of back: Secondary | ICD-10-CM | POA: Diagnosis not present

## 2019-07-21 DIAGNOSIS — M9902 Segmental and somatic dysfunction of thoracic region: Secondary | ICD-10-CM | POA: Diagnosis not present

## 2019-07-21 DIAGNOSIS — M5414 Radiculopathy, thoracic region: Secondary | ICD-10-CM | POA: Diagnosis not present

## 2019-07-24 DIAGNOSIS — E785 Hyperlipidemia, unspecified: Secondary | ICD-10-CM | POA: Diagnosis not present

## 2019-08-05 DIAGNOSIS — M9902 Segmental and somatic dysfunction of thoracic region: Secondary | ICD-10-CM | POA: Diagnosis not present

## 2019-08-05 DIAGNOSIS — M5414 Radiculopathy, thoracic region: Secondary | ICD-10-CM | POA: Diagnosis not present

## 2019-08-05 DIAGNOSIS — M9901 Segmental and somatic dysfunction of cervical region: Secondary | ICD-10-CM | POA: Diagnosis not present

## 2019-08-05 DIAGNOSIS — M62830 Muscle spasm of back: Secondary | ICD-10-CM | POA: Diagnosis not present

## 2019-08-19 ENCOUNTER — Other Ambulatory Visit: Payer: Self-pay | Admitting: Obstetrics and Gynecology

## 2019-08-19 DIAGNOSIS — Z01419 Encounter for gynecological examination (general) (routine) without abnormal findings: Secondary | ICD-10-CM | POA: Diagnosis not present

## 2019-08-19 DIAGNOSIS — Z1231 Encounter for screening mammogram for malignant neoplasm of breast: Secondary | ICD-10-CM

## 2019-08-19 DIAGNOSIS — Z1331 Encounter for screening for depression: Secondary | ICD-10-CM | POA: Diagnosis not present

## 2019-08-25 DIAGNOSIS — M5414 Radiculopathy, thoracic region: Secondary | ICD-10-CM | POA: Diagnosis not present

## 2019-08-25 DIAGNOSIS — M9901 Segmental and somatic dysfunction of cervical region: Secondary | ICD-10-CM | POA: Diagnosis not present

## 2019-08-25 DIAGNOSIS — M62830 Muscle spasm of back: Secondary | ICD-10-CM | POA: Diagnosis not present

## 2019-08-25 DIAGNOSIS — M9902 Segmental and somatic dysfunction of thoracic region: Secondary | ICD-10-CM | POA: Diagnosis not present

## 2019-09-15 DIAGNOSIS — M9902 Segmental and somatic dysfunction of thoracic region: Secondary | ICD-10-CM | POA: Diagnosis not present

## 2019-09-15 DIAGNOSIS — M9901 Segmental and somatic dysfunction of cervical region: Secondary | ICD-10-CM | POA: Diagnosis not present

## 2019-09-15 DIAGNOSIS — M5414 Radiculopathy, thoracic region: Secondary | ICD-10-CM | POA: Diagnosis not present

## 2019-09-15 DIAGNOSIS — M62830 Muscle spasm of back: Secondary | ICD-10-CM | POA: Diagnosis not present

## 2019-10-14 DIAGNOSIS — M5414 Radiculopathy, thoracic region: Secondary | ICD-10-CM | POA: Diagnosis not present

## 2019-10-14 DIAGNOSIS — M9902 Segmental and somatic dysfunction of thoracic region: Secondary | ICD-10-CM | POA: Diagnosis not present

## 2019-10-14 DIAGNOSIS — M62830 Muscle spasm of back: Secondary | ICD-10-CM | POA: Diagnosis not present

## 2019-10-14 DIAGNOSIS — M9901 Segmental and somatic dysfunction of cervical region: Secondary | ICD-10-CM | POA: Diagnosis not present

## 2019-10-19 ENCOUNTER — Other Ambulatory Visit: Payer: Self-pay | Admitting: *Deleted

## 2019-10-19 DIAGNOSIS — C50811 Malignant neoplasm of overlapping sites of right female breast: Secondary | ICD-10-CM

## 2019-10-20 DIAGNOSIS — M9902 Segmental and somatic dysfunction of thoracic region: Secondary | ICD-10-CM | POA: Diagnosis not present

## 2019-10-20 DIAGNOSIS — M9901 Segmental and somatic dysfunction of cervical region: Secondary | ICD-10-CM | POA: Diagnosis not present

## 2019-10-20 DIAGNOSIS — M62830 Muscle spasm of back: Secondary | ICD-10-CM | POA: Diagnosis not present

## 2019-10-20 DIAGNOSIS — M5414 Radiculopathy, thoracic region: Secondary | ICD-10-CM | POA: Diagnosis not present

## 2019-10-26 ENCOUNTER — Other Ambulatory Visit: Payer: Self-pay

## 2019-10-26 ENCOUNTER — Inpatient Hospital Stay: Payer: 59 | Attending: Internal Medicine

## 2019-10-26 ENCOUNTER — Encounter: Payer: Self-pay | Admitting: Internal Medicine

## 2019-10-26 ENCOUNTER — Inpatient Hospital Stay (HOSPITAL_BASED_OUTPATIENT_CLINIC_OR_DEPARTMENT_OTHER): Payer: 59 | Admitting: Internal Medicine

## 2019-10-26 DIAGNOSIS — Z923 Personal history of irradiation: Secondary | ICD-10-CM | POA: Diagnosis not present

## 2019-10-26 DIAGNOSIS — M779 Enthesopathy, unspecified: Secondary | ICD-10-CM | POA: Diagnosis not present

## 2019-10-26 DIAGNOSIS — M8580 Other specified disorders of bone density and structure, unspecified site: Secondary | ICD-10-CM | POA: Insufficient documentation

## 2019-10-26 DIAGNOSIS — G40909 Epilepsy, unspecified, not intractable, without status epilepticus: Secondary | ICD-10-CM | POA: Diagnosis not present

## 2019-10-26 DIAGNOSIS — Z803 Family history of malignant neoplasm of breast: Secondary | ICD-10-CM | POA: Diagnosis not present

## 2019-10-26 DIAGNOSIS — Z170 Estrogen receptor positive status [ER+]: Secondary | ICD-10-CM | POA: Insufficient documentation

## 2019-10-26 DIAGNOSIS — C50811 Malignant neoplasm of overlapping sites of right female breast: Secondary | ICD-10-CM | POA: Diagnosis not present

## 2019-10-26 LAB — CBC WITH DIFFERENTIAL/PLATELET
Abs Immature Granulocytes: 0.01 10*3/uL (ref 0.00–0.07)
Basophils Absolute: 0.1 10*3/uL (ref 0.0–0.1)
Basophils Relative: 1 %
Eosinophils Absolute: 0.1 10*3/uL (ref 0.0–0.5)
Eosinophils Relative: 2 %
HCT: 41.3 % (ref 36.0–46.0)
Hemoglobin: 14.4 g/dL (ref 12.0–15.0)
Immature Granulocytes: 0 %
Lymphocytes Relative: 22 %
Lymphs Abs: 1.2 10*3/uL (ref 0.7–4.0)
MCH: 30.5 pg (ref 26.0–34.0)
MCHC: 34.9 g/dL (ref 30.0–36.0)
MCV: 87.5 fL (ref 80.0–100.0)
Monocytes Absolute: 0.4 10*3/uL (ref 0.1–1.0)
Monocytes Relative: 7 %
Neutro Abs: 3.7 10*3/uL (ref 1.7–7.7)
Neutrophils Relative %: 68 %
Platelets: 187 10*3/uL (ref 150–400)
RBC: 4.72 MIL/uL (ref 3.87–5.11)
RDW: 12.9 % (ref 11.5–15.5)
WBC: 5.3 10*3/uL (ref 4.0–10.5)
nRBC: 0 % (ref 0.0–0.2)

## 2019-10-26 LAB — COMPREHENSIVE METABOLIC PANEL
ALT: 38 U/L (ref 0–44)
AST: 36 U/L (ref 15–41)
Albumin: 4.2 g/dL (ref 3.5–5.0)
Alkaline Phosphatase: 77 U/L (ref 38–126)
Anion gap: 5 (ref 5–15)
BUN: 16 mg/dL (ref 8–23)
CO2: 30 mmol/L (ref 22–32)
Calcium: 9.5 mg/dL (ref 8.9–10.3)
Chloride: 104 mmol/L (ref 98–111)
Creatinine, Ser: 0.99 mg/dL (ref 0.44–1.00)
GFR calc Af Amer: >60 mL/min (ref >60)
GFR calc non Af Amer: >60 mL/min (ref >60)
Glucose, Bld: 98 mg/dL (ref 70–99)
Potassium: 4.1 mmol/L (ref 3.5–5.1)
Sodium: 139 mmol/L (ref 135–145)
Total Bilirubin: 0.7 mg/dL (ref 0.3–1.2)
Total Protein: 6.8 g/dL (ref 6.5–8.1)

## 2019-10-26 NOTE — Progress Notes (Signed)
Ashley Lester OFFICE PROGRESS NOTE  Patient Care Team: Albina Billet, MD as PCP - General (Unknown Physician Specialty) Robert Bellow, MD (General Surgery)  Cancer Staging No matching staging information was found for the patient.   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   Tumor size 1.6 cm, grade 2 histology, margins negative.  2 sentinel lymph nodes negative for metastasis. ER and PR positive (80%). HER-2/neu negative by FISH (HER-2/CEP17 ratio 1.08).  ONCOTYPE Dx test 12/08/10 shows Breast cancer Recurrence score of 11 (low risk) with average rate of distant recurrence of 7%.  Started aromatase inhibitor early February 2013; OCT 2017- [facility]- low benefit for extended Anti-hormone [3.6% over 5-10 years]; March 2018-STOP arimidex  # AUG 2017- BMD- OSTEOPENIA --------------------------------------------------   DIAGNOSIS: BREAST CANCER  STAGE: I        ;GOALS: cure CURRENT/MOST RECENT THERAPY : surveillaince    Malignant neoplasm of overlapping sites of right breast ([place]) (Resolved)  Carcinoma of overlapping sites of right breast in female, estrogen receptor positive ([place])      INTERVAL HISTORY:  Ashley Lester 64 y.o.  female pleasant patient above history of Stage I ER/PR positive HER-2 negative breast cancer right-sided- Is here for follow-up/currently on surveillance.  Patient noted to have a lump in the left hand/palmar surface.  No pain.  Denies getting any significantly worse in the last few weeks.  Otherwise appetite is good.  No weight loss but no bone pain.  No nausea vomiting.  She continues to exercise.   Review of Systems  Constitutional: Negative for chills, diaphoresis, fever, malaise/fatigue and weight loss.  HENT: Negative for nosebleeds and sore throat.   Eyes: Negative for double vision.  Respiratory: Negative  for cough, hemoptysis, sputum production, shortness of breath and wheezing.   Cardiovascular: Negative for chest pain, palpitations, orthopnea and leg swelling.  Gastrointestinal: Negative for abdominal pain, blood in stool, constipation, diarrhea, heartburn, melena, nausea and vomiting.  Genitourinary: Negative for dysuria, frequency and urgency.  Musculoskeletal: Negative for back pain and joint pain.  Skin: Negative.  Negative for itching and rash.  Neurological: Negative for dizziness, tingling, focal weakness, weakness and headaches.  Endo/Heme/Allergies: Does not bruise/bleed easily.  Psychiatric/Behavioral: Negative for depression. The patient is not nervous/anxious and does not have insomnia.      PAST MEDICAL HISTORY :  Past Medical History:  Diagnosis Date  . Breast cancer ([place]) 2012   right breast, radiation  . Breast screening, unspecified   . Cellulitis and abscess of unspecified site   . Epilepsy ([place])   . Lump or mass in breast   . Malignant neoplasm of upper-outer quadrant of female breast ([place])    diagnosed on 11-22-10; right breast cancer, invasive mammary cancer, 1.6 cm, Sentinel lymph node negative, ER/PR positive, HER-2/neu is not amplified, Oncotype Dx assay recurrent score 11 (7% chance of distant recurrence at 10 years)  . Personal history of malignant neoplasm of breast    treated with right breast wide excision with sentinel node biopsy, mastoplasty, and radiation therapy  . Personal history of radiation therapy 2012   right breast ca  . 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 Seizure disorder ([place]) 1964  . Special screening for malignant neoplasms, colon     PAST SURGICAL HISTORY :   Past Surgical History:  Procedure Laterality Date  . BREAST BIOPSY Right 2012  [facility]  . BREAST LUMPECTOMY Right 2012    right breast cancer, invasive mammary cancer,  . BREAST SURGERY Right 2012   right breast wide excision and sentinel node biopsy completed on 11-21-10  identified a 1.6 cm histologic grade 2, ER/PR positive, HER-2/neu not overexpressing carcinoma with clear margins. Sentinel nodes x2 were negative for metastatic disease. Low Oncotype DX at 7%.  . CHOLECYSTECTOMY  2005  . COLONOSCOPY  06/01/2008   Loistine Simas, M.D. Normal exam.  . COLONOSCOPY WITH PROPOFOL N/A 05/18/2019   Procedure: COLONOSCOPY WITH PROPOFOL;  Surgeon: Toledo, Benay Pike, MD;  Location: [facility] ENDOSCOPY;  Service: Gastroenterology;  Laterality: N/A;  . GANGLION CYST EXCISION  1980's  . radiation therapy Right    breast cancer  . WISDOM TOOTH EXTRACTION  1980's    FAMILY HISTORY :   Family History  Problem Relation Age of Onset  . Leukemia Sister 62  . Breast cancer Maternal Aunt   . Testicular cancer Other        nephew, age late 52's    SOCIAL HISTORY:   Social History   Tobacco Use  . Smoking status: Never Smoker  . Smokeless tobacco: Never Used  Substance Use Topics  . Alcohol use: No    Alcohol/week: 0.0 standard drinks  . Drug use: No    ALLERGIES:  has No Known Allergies.  MEDICATIONS:  Current Outpatient Medications  Medication Sig Dispense Refill  . calcium acetate (PHOSLO) 667 MG capsule Take 667 mg by mouth 3 (three) times daily with meals.    . Calcium Carb-Cholecalciferol (CALCIUM 1000 + D PO) Take 1 tablet 3 (three) times daily by mouth.     . Inulin-Calcium-Vitamin D (FIBERCHOICE PLUS CALCIUM PO) Take 1 tablet by mouth daily.     . Multiple Vitamins-Minerals (PRESERVISION AREDS 2 PO) Take by mouth.    . Nutritional Supplements (JUICE PLUS FIBRE PO) Take by mouth.    . Omega-3 Fatty Acids (FISH OIL) 1000 MG CAPS Take 3 (three) times daily by mouth.     Marland Kitchen PREVIDENT 5000 SENSITIVE 1.1-5 % PSTE USE AS DIRECTED 1 TIME DAILY  3  . Red Yeast Rice Extract (RED YEAST RICE PO) Take by mouth 2 (two) times daily.     No current facility-administered medications for this visit.    PHYSICAL EXAMINATION: ECOG PERFORMANCE STATUS: 0 -  Asymptomatic  BP 115/86 (BP Location: Left Arm, Patient Position: Sitting, Cuff Size: Normal)   Pulse 65   Temp 98.3 F (36.8 C) (Tympanic)   Resp 16   Ht '5\' 3"'  (1.6 m)   Wt 149 lb (67.6 kg)   SpO2 100%   BMI 26.39 kg/m   Filed Weights   10/26/19 1020  Weight: 149 lb (67.6 kg)    Physical Exam HENT:     Head: Normocephalic and atraumatic.     Mouth/Throat:     Pharynx: No oropharyngeal exudate.  Eyes:     Pupils: Pupils are equal, round, and reactive to light.  Cardiovascular:     Rate and Rhythm: Normal rate and regular rhythm.  Pulmonary:     Effort: No respiratory distress.     Breath sounds: No wheezing.  Abdominal:     General: Bowel sounds are normal. There is no distension.     Palpations: Abdomen is soft. There is no mass.     Tenderness: There is no abdominal tenderness. There is no guarding or rebound.  Musculoskeletal:        General: No  tenderness. Normal range of motion.     Cervical back: Normal range of motion and neck supple.     Comments: Prominence of the tendon below the middle finger right palmar surface of the hand  Skin:    General: Skin is warm.  Neurological:     Mental Status: She is alert and oriented to person, place, and time.  Psychiatric:        Mood and Affect: Affect normal.     LABORATORY DATA:  I have reviewed the data as listed    Component Value Date/Time   NA 139 10/26/2019 1007   K 4.1 10/26/2019 1007   CL 104 10/26/2019 1007   CO2 30 10/26/2019 1007   GLUCOSE 98 10/26/2019 1007   BUN 16 10/26/2019 1007   CREATININE 0.99 10/26/2019 1007   CREATININE 0.94 06/10/2013 1010   CALCIUM 9.5 10/26/2019 1007   PROT 6.8 10/26/2019 1007   PROT 7.1 06/10/2013 1010   ALBUMIN 4.2 10/26/2019 1007   ALBUMIN 3.9 06/10/2013 1010   AST 36 10/26/2019 1007   AST 60 (H) 06/10/2013 1010   ALT 38 10/26/2019 1007   ALT 85 (H) 06/10/2013 1010   ALKPHOS 77 10/26/2019 1007   ALKPHOS 125 (H) 06/10/2013 1010   BILITOT 0.7 10/26/2019 1007    BILITOT 0.4 06/10/2013 1010   GFRNONAA >60 10/26/2019 1007   GFRNONAA >60 06/10/2013 1010   GFRAA >60 10/26/2019 1007   GFRAA >60 06/10/2013 1010    No results found for: SPEP, UPEP  Lab Results  Component Value Date   WBC 5.3 10/26/2019   NEUTROABS 3.7 10/26/2019   HGB 14.4 10/26/2019   HCT 41.3 10/26/2019   MCV 87.5 10/26/2019   PLT 187 10/26/2019      Chemistry      Component Value Date/Time   NA 139 10/26/2019 1007   K 4.1 10/26/2019 1007   CL 104 10/26/2019 1007   CO2 30 10/26/2019 1007   BUN 16 10/26/2019 1007   CREATININE 0.99 10/26/2019 1007   CREATININE 0.94 06/10/2013 1010      Component Value Date/Time   CALCIUM 9.5 10/26/2019 1007   ALKPHOS 77 10/26/2019 1007   ALKPHOS 125 (H) 06/10/2013 1010   AST 36 10/26/2019 1007   AST 60 (H) 06/10/2013 1010   ALT 38 10/26/2019 1007   ALT 85 (H) 06/10/2013 1010   BILITOT 0.7 10/26/2019 1007   BILITOT 0.4 06/10/2013 1010       RADIOGRAPHIC STUDIES: I have personally reviewed the radiological images as listed and agreed with the findings in the report. No results found.   ASSESSMENT & PLAN:  Carcinoma of overlapping sites of right breast in female, estrogen receptor positive ([place]) # Right breast cancer; Stage I ER/PR Positive; Her 2 Neu-NEG; low risk Oncotype- on anastrazole [stopped March 2018 x5 years].   #Clinically NED. Mammograms- NOV 2020; continue follow up with Dr. Ouida Sills in April 2021-breast exam with gynecology.  # Osteopenia- BMD- SEP 2019= -1.7 Continue calcium plus vitamin D. Discussed the potential risk factors for osteoporosis- age/gender/postmenopausal status/use of anti-estrogen treatments. Discussed multiple options including exercise/ calcium and vitamin D supplementation/ and also use of medications. Will order mammogram in Nov 2021/with mammo.   # Left hand bump- ? Tendonitis- no symtoms; monitor for now; if worse folow p wit Pcp.   #  DISPOSITION: # BMD- with her mammogram in nov  2021 # Follow up in 12 months.MD; / labs-cbc/cmp Dr.B  Orders Placed This Encounter  Procedures  . DG BONE DENSITY (DXA)    Standing Status:   Future    Standing Expiration Date:   10/25/2020    Order Specific Question:   Reason for Exam (SYMPTOM  OR DIAGNOSIS REQUIRED)    Answer:   hx of breast cancer    Order Specific Question:   Preferred imaging location?    Answer:   [facility]      Cammie Sickle, MD 10/26/2019 11:12 AM

## 2019-10-26 NOTE — Progress Notes (Signed)
Has lump in left hand that started a couple months ago. Just wants to make sure it is nothing serious.

## 2019-10-26 NOTE — Addendum Note (Signed)
Addended by: Delice Bison E on: 10/26/2019 03:37 PM   Modules accepted: Orders

## 2019-10-26 NOTE — Assessment & Plan Note (Signed)
#   Right breast cancer; Stage I ER/PR Positive; Her 2 Neu-NEG; low risk Oncotype- on anastrazole [stopped March 2018 x5 years].   #Clinically NED. Mammograms- NOV 2020; continue follow up with Dr. Ouida Sills in April 2021-breast exam with gynecology.  # Osteopenia- BMD- SEP 2019= -1.7 Continue calcium plus vitamin D. Discussed the potential risk factors for osteoporosis- age/gender/postmenopausal status/use of anti-estrogen treatments. Discussed multiple options including exercise/ calcium and vitamin D supplementation/ and also use of medications. Will order mammogram in Nov 2021/with mammo.   # Left hand bump- ? Tendonitis- no symtoms; monitor for now; if worse folow p wit Pcp.   #  DISPOSITION: # BMD- with her mammogram in nov 2021 # Follow up in 12 months.MD; / labs-cbc/cmp Dr.B

## 2019-10-27 DIAGNOSIS — M62830 Muscle spasm of back: Secondary | ICD-10-CM | POA: Diagnosis not present

## 2019-10-27 DIAGNOSIS — M5414 Radiculopathy, thoracic region: Secondary | ICD-10-CM | POA: Diagnosis not present

## 2019-10-27 DIAGNOSIS — M9901 Segmental and somatic dysfunction of cervical region: Secondary | ICD-10-CM | POA: Diagnosis not present

## 2019-10-27 DIAGNOSIS — M9902 Segmental and somatic dysfunction of thoracic region: Secondary | ICD-10-CM | POA: Diagnosis not present

## 2019-11-03 DIAGNOSIS — M9901 Segmental and somatic dysfunction of cervical region: Secondary | ICD-10-CM | POA: Diagnosis not present

## 2019-11-03 DIAGNOSIS — M9902 Segmental and somatic dysfunction of thoracic region: Secondary | ICD-10-CM | POA: Diagnosis not present

## 2019-11-03 DIAGNOSIS — M5414 Radiculopathy, thoracic region: Secondary | ICD-10-CM | POA: Diagnosis not present

## 2019-11-03 DIAGNOSIS — M62830 Muscle spasm of back: Secondary | ICD-10-CM | POA: Diagnosis not present

## 2019-11-10 DIAGNOSIS — M9902 Segmental and somatic dysfunction of thoracic region: Secondary | ICD-10-CM | POA: Diagnosis not present

## 2019-11-10 DIAGNOSIS — M62830 Muscle spasm of back: Secondary | ICD-10-CM | POA: Diagnosis not present

## 2019-11-10 DIAGNOSIS — M5414 Radiculopathy, thoracic region: Secondary | ICD-10-CM | POA: Diagnosis not present

## 2019-11-10 DIAGNOSIS — M9901 Segmental and somatic dysfunction of cervical region: Secondary | ICD-10-CM | POA: Diagnosis not present

## 2019-12-08 DIAGNOSIS — H40153 Residual stage of open-angle glaucoma, bilateral: Secondary | ICD-10-CM | POA: Diagnosis not present

## 2019-12-09 ENCOUNTER — Ambulatory Visit
Admission: RE | Admit: 2019-12-09 | Discharge: 2019-12-09 | Disposition: A | Discharge status: Home / Self Care | Payer: 59 | Source: Ambulatory Visit | Attending: Internal Medicine | Admitting: Internal Medicine

## 2019-12-09 ENCOUNTER — Other Ambulatory Visit: Payer: Self-pay

## 2019-12-09 ENCOUNTER — Ambulatory Visit
Admission: RE | Admit: 2019-12-09 | Discharge: 2019-12-09 | Disposition: A | Discharge status: Home / Self Care | Payer: 59 | Source: Ambulatory Visit | Attending: Obstetrics and Gynecology | Admitting: Obstetrics and Gynecology

## 2019-12-09 DIAGNOSIS — C50811 Malignant neoplasm of overlapping sites of right female breast: Secondary | ICD-10-CM

## 2019-12-09 DIAGNOSIS — M85851 Other specified disorders of bone density and structure, right thigh: Secondary | ICD-10-CM | POA: Diagnosis not present

## 2019-12-09 DIAGNOSIS — Z1231 Encounter for screening mammogram for malignant neoplasm of breast: Secondary | ICD-10-CM | POA: Insufficient documentation

## 2019-12-09 DIAGNOSIS — Z780 Asymptomatic menopausal state: Secondary | ICD-10-CM | POA: Diagnosis not present

## 2019-12-09 DIAGNOSIS — Z170 Estrogen receptor positive status [ER+]: Secondary | ICD-10-CM

## 2019-12-15 DIAGNOSIS — M9901 Segmental and somatic dysfunction of cervical region: Secondary | ICD-10-CM | POA: Diagnosis not present

## 2019-12-15 DIAGNOSIS — M9902 Segmental and somatic dysfunction of thoracic region: Secondary | ICD-10-CM | POA: Diagnosis not present

## 2019-12-15 DIAGNOSIS — M5414 Radiculopathy, thoracic region: Secondary | ICD-10-CM | POA: Diagnosis not present

## 2019-12-15 DIAGNOSIS — M62830 Muscle spasm of back: Secondary | ICD-10-CM | POA: Diagnosis not present

## 2019-12-16 ENCOUNTER — Ambulatory Visit: Payer: Self-pay

## 2019-12-16 DIAGNOSIS — Z23 Encounter for immunization: Secondary | ICD-10-CM

## 2019-12-29 DIAGNOSIS — M9902 Segmental and somatic dysfunction of thoracic region: Secondary | ICD-10-CM | POA: Diagnosis not present

## 2019-12-29 DIAGNOSIS — M9901 Segmental and somatic dysfunction of cervical region: Secondary | ICD-10-CM | POA: Diagnosis not present

## 2019-12-29 DIAGNOSIS — M5414 Radiculopathy, thoracic region: Secondary | ICD-10-CM | POA: Diagnosis not present

## 2019-12-29 DIAGNOSIS — M62830 Muscle spasm of back: Secondary | ICD-10-CM | POA: Diagnosis not present

## 2020-01-08 DIAGNOSIS — E785 Hyperlipidemia, unspecified: Secondary | ICD-10-CM | POA: Diagnosis not present

## 2020-01-18 DIAGNOSIS — D0590 Unspecified type of carcinoma in situ of unspecified breast: Secondary | ICD-10-CM | POA: Diagnosis not present

## 2020-01-18 DIAGNOSIS — E785 Hyperlipidemia, unspecified: Secondary | ICD-10-CM | POA: Diagnosis not present

## 2020-01-18 DIAGNOSIS — M8580 Other specified disorders of bone density and structure, unspecified site: Secondary | ICD-10-CM | POA: Diagnosis not present

## 2020-01-19 ENCOUNTER — Telehealth: Payer: Self-pay | Admitting: Internal Medicine

## 2020-01-19 DIAGNOSIS — M9901 Segmental and somatic dysfunction of cervical region: Secondary | ICD-10-CM | POA: Diagnosis not present

## 2020-01-19 DIAGNOSIS — M62830 Muscle spasm of back: Secondary | ICD-10-CM | POA: Diagnosis not present

## 2020-01-19 DIAGNOSIS — M5414 Radiculopathy, thoracic region: Secondary | ICD-10-CM | POA: Diagnosis not present

## 2020-01-19 DIAGNOSIS — M9902 Segmental and somatic dysfunction of thoracic region: Secondary | ICD-10-CM | POA: Diagnosis not present

## 2020-01-19 NOTE — Telephone Encounter (Signed)
On 12/21-spoke to patient regarding the results of the bone density-osteoporosis.  Patient requested further information on treatment options.  Sent over my chart.

## 2020-04-25 DIAGNOSIS — E785 Hyperlipidemia, unspecified: Secondary | ICD-10-CM | POA: Diagnosis not present

## 2020-06-13 DIAGNOSIS — H353131 Nonexudative age-related macular degeneration, bilateral, early dry stage: Secondary | ICD-10-CM | POA: Diagnosis not present

## 2020-06-13 DIAGNOSIS — H524 Presbyopia: Secondary | ICD-10-CM | POA: Diagnosis not present

## 2020-06-13 DIAGNOSIS — H40153 Residual stage of open-angle glaucoma, bilateral: Secondary | ICD-10-CM | POA: Diagnosis not present

## 2020-07-12 DIAGNOSIS — M9901 Segmental and somatic dysfunction of cervical region: Secondary | ICD-10-CM | POA: Diagnosis not present

## 2020-07-12 DIAGNOSIS — M62830 Muscle spasm of back: Secondary | ICD-10-CM | POA: Diagnosis not present

## 2020-07-12 DIAGNOSIS — M9903 Segmental and somatic dysfunction of lumbar region: Secondary | ICD-10-CM | POA: Diagnosis not present

## 2020-07-12 DIAGNOSIS — M5136 Other intervertebral disc degeneration, lumbar region: Secondary | ICD-10-CM | POA: Diagnosis not present

## 2020-08-16 DIAGNOSIS — M9901 Segmental and somatic dysfunction of cervical region: Secondary | ICD-10-CM | POA: Diagnosis not present

## 2020-08-16 DIAGNOSIS — M9903 Segmental and somatic dysfunction of lumbar region: Secondary | ICD-10-CM | POA: Diagnosis not present

## 2020-08-16 DIAGNOSIS — M62830 Muscle spasm of back: Secondary | ICD-10-CM | POA: Diagnosis not present

## 2020-08-16 DIAGNOSIS — M5136 Other intervertebral disc degeneration, lumbar region: Secondary | ICD-10-CM | POA: Diagnosis not present

## 2020-08-30 ENCOUNTER — Other Ambulatory Visit: Payer: Self-pay | Admitting: Obstetrics and Gynecology

## 2020-08-30 DIAGNOSIS — Z1211 Encounter for screening for malignant neoplasm of colon: Secondary | ICD-10-CM | POA: Diagnosis not present

## 2020-08-30 DIAGNOSIS — Z23 Encounter for immunization: Secondary | ICD-10-CM | POA: Diagnosis not present

## 2020-08-30 DIAGNOSIS — Z1231 Encounter for screening mammogram for malignant neoplasm of breast: Secondary | ICD-10-CM | POA: Diagnosis not present

## 2020-08-30 DIAGNOSIS — Z01419 Encounter for gynecological examination (general) (routine) without abnormal findings: Secondary | ICD-10-CM | POA: Diagnosis not present

## 2020-09-07 DIAGNOSIS — Z1211 Encounter for screening for malignant neoplasm of colon: Secondary | ICD-10-CM | POA: Diagnosis not present

## 2020-09-20 DIAGNOSIS — M9901 Segmental and somatic dysfunction of cervical region: Secondary | ICD-10-CM | POA: Diagnosis not present

## 2020-09-20 DIAGNOSIS — M62830 Muscle spasm of back: Secondary | ICD-10-CM | POA: Diagnosis not present

## 2020-09-20 DIAGNOSIS — M5136 Other intervertebral disc degeneration, lumbar region: Secondary | ICD-10-CM | POA: Diagnosis not present

## 2020-09-20 DIAGNOSIS — M9903 Segmental and somatic dysfunction of lumbar region: Secondary | ICD-10-CM | POA: Diagnosis not present

## 2020-10-19 DIAGNOSIS — M62830 Muscle spasm of back: Secondary | ICD-10-CM | POA: Diagnosis not present

## 2020-10-19 DIAGNOSIS — M9901 Segmental and somatic dysfunction of cervical region: Secondary | ICD-10-CM | POA: Diagnosis not present

## 2020-10-19 DIAGNOSIS — M9903 Segmental and somatic dysfunction of lumbar region: Secondary | ICD-10-CM | POA: Diagnosis not present

## 2020-10-19 DIAGNOSIS — M5136 Other intervertebral disc degeneration, lumbar region: Secondary | ICD-10-CM | POA: Diagnosis not present

## 2020-10-20 ENCOUNTER — Other Ambulatory Visit: Payer: Self-pay | Admitting: *Deleted

## 2020-10-20 DIAGNOSIS — C50811 Malignant neoplasm of overlapping sites of right female breast: Secondary | ICD-10-CM

## 2020-10-25 ENCOUNTER — Encounter: Payer: Self-pay | Admitting: Oncology

## 2020-10-25 ENCOUNTER — Inpatient Hospital Stay: Payer: 59 | Attending: Oncology

## 2020-10-25 ENCOUNTER — Inpatient Hospital Stay (HOSPITAL_BASED_OUTPATIENT_CLINIC_OR_DEPARTMENT_OTHER): Payer: 59 | Admitting: Oncology

## 2020-10-25 VITALS — BP 136/84 | HR 57 | Temp 97.4°F | Resp 17 | Wt 146.4 lb

## 2020-10-25 DIAGNOSIS — G40909 Epilepsy, unspecified, not intractable, without status epilepticus: Secondary | ICD-10-CM | POA: Insufficient documentation

## 2020-10-25 DIAGNOSIS — C50811 Malignant neoplasm of overlapping sites of right female breast: Secondary | ICD-10-CM

## 2020-10-25 DIAGNOSIS — R7989 Other specified abnormal findings of blood chemistry: Secondary | ICD-10-CM

## 2020-10-25 DIAGNOSIS — Z170 Estrogen receptor positive status [ER+]: Secondary | ICD-10-CM | POA: Insufficient documentation

## 2020-10-25 DIAGNOSIS — M810 Age-related osteoporosis without current pathological fracture: Secondary | ICD-10-CM | POA: Insufficient documentation

## 2020-10-25 DIAGNOSIS — Z806 Family history of leukemia: Secondary | ICD-10-CM | POA: Insufficient documentation

## 2020-10-25 DIAGNOSIS — Z79811 Long term (current) use of aromatase inhibitors: Secondary | ICD-10-CM | POA: Insufficient documentation

## 2020-10-25 DIAGNOSIS — Z803 Family history of malignant neoplasm of breast: Secondary | ICD-10-CM | POA: Insufficient documentation

## 2020-10-25 DIAGNOSIS — Z923 Personal history of irradiation: Secondary | ICD-10-CM | POA: Insufficient documentation

## 2020-10-25 LAB — COMPREHENSIVE METABOLIC PANEL
ALT: 64 U/L — ABNORMAL HIGH (ref 0–44)
AST: 70 U/L — ABNORMAL HIGH (ref 15–41)
Albumin: 4.2 g/dL (ref 3.5–5.0)
Alkaline Phosphatase: 75 U/L (ref 38–126)
Anion gap: 6 (ref 5–15)
BUN: 16 mg/dL (ref 8–23)
CO2: 31 mmol/L (ref 22–32)
Calcium: 10.1 mg/dL (ref 8.9–10.3)
Chloride: 103 mmol/L (ref 98–111)
Creatinine, Ser: 1.02 mg/dL — ABNORMAL HIGH (ref 0.44–1.00)
GFR, Estimated: >60 mL/min (ref >60)
Glucose, Bld: 98 mg/dL (ref 70–99)
Potassium: 3.8 mmol/L (ref 3.5–5.1)
Sodium: 140 mmol/L (ref 135–145)
Total Bilirubin: 1.1 mg/dL (ref 0.3–1.2)
Total Protein: 7 g/dL (ref 6.5–8.1)

## 2020-10-25 LAB — CBC WITH DIFFERENTIAL/PLATELET
Abs Immature Granulocytes: 0.01 10*3/uL (ref 0.00–0.07)
Basophils Absolute: 0.1 10*3/uL (ref 0.0–0.1)
Basophils Relative: 1 %
Eosinophils Absolute: 0.1 10*3/uL (ref 0.0–0.5)
Eosinophils Relative: 3 %
HCT: 42.3 % (ref 36.0–46.0)
Hemoglobin: 14.4 g/dL (ref 12.0–15.0)
Immature Granulocytes: 0 %
Lymphocytes Relative: 29 %
Lymphs Abs: 1.3 10*3/uL (ref 0.7–4.0)
MCH: 30.8 pg (ref 26.0–34.0)
MCHC: 34 g/dL (ref 30.0–36.0)
MCV: 90.6 fL (ref 80.0–100.0)
Monocytes Absolute: 0.3 10*3/uL (ref 0.1–1.0)
Monocytes Relative: 7 %
Neutro Abs: 2.6 10*3/uL (ref 1.7–7.7)
Neutrophils Relative %: 60 %
Platelets: 165 10*3/uL (ref 150–400)
RBC: 4.67 MIL/uL (ref 3.87–5.11)
RDW: 12.9 % (ref 11.5–15.5)
WBC: 4.4 10*3/uL (ref 4.0–10.5)
nRBC: 0 % (ref 0.0–0.2)

## 2020-10-25 NOTE — Progress Notes (Signed)
Pt having some fatigue. Appetite is good. Denies any breast pain or concerns. Yearly follow up.

## 2020-10-25 NOTE — Progress Notes (Signed)
[facility] OFFICE PROGRESS NOTE  Patient Care Team: Albina Billet, MD as PCP - General (Unknown Physician Specialty) Robert Bellow, MD (General Surgery)  Cancer Staging No matching staging information was found for the patient.   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   Tumor size 1.6 cm, grade 2 histology, margins negative.  2 sentinel lymph nodes negative for metastasis. ER and PR positive (80%). HER-2/neu negative by FISH (HER-2/CEP17 ratio 1.08).   ONCOTYPE Dx test 12/08/10 shows Breast cancer Recurrence score of 11 (low risk) with average rate of distant recurrence of 7%.   Started aromatase inhibitor early February 2013; OCT 2017- [facility]- low benefit for extended Anti-hormone [3.6% over 5-10 years]; March 2018-STOP arimidex  # AUG 2017- BMD- OSTEOPENIA --------------------------------------------------   DIAGNOSIS: BREAST CANCER  STAGE: I        ;GOALS: cure CURRENT/MOST RECENT THERAPY : surveillaince    Malignant neoplasm of overlapping sites of right breast (HCC) (Resolved)  Carcinoma of overlapping sites of right breast in female, estrogen receptor positive ([place])    INTERVAL HISTORY: Mrs. Warshawsky presents today for follow-up and assessment for stage I ER PR positive HER2/neu negative right breast cancer.  In the interim, she had a bone density scan on 12/09/2019 which showed a T score of -2.7.  She is currently taking calcium and vitamin D.  She denies any new concerns today.  She is scheduled for mammogram in November.  Review of Systems  Constitutional: Negative.  Negative for chills, fever, malaise/fatigue and weight loss.  HENT:  Negative for congestion, ear pain and tinnitus.   Eyes: Negative.  Negative for blurred vision and double vision.  Respiratory: Negative.  Negative for cough, sputum production and shortness of  breath.   Cardiovascular: Negative.  Negative for chest pain, palpitations and leg swelling.  Gastrointestinal: Negative.  Negative for abdominal pain, constipation, diarrhea, nausea and vomiting.  Genitourinary:  Negative for dysuria, frequency and urgency.  Musculoskeletal:  Negative for back pain and falls.  Skin: Negative.  Negative for rash.  Neurological: Negative.  Negative for weakness and headaches.  Endo/Heme/Allergies: Negative.  Does not bruise/bleed easily.  Psychiatric/Behavioral: Negative.  Negative for depression. The patient is not nervous/anxious and does not have insomnia.     PAST MEDICAL HISTORY :  Past Medical History:  Diagnosis Date   Breast cancer ([place]) 2012   right breast, radiation   Breast screening, unspecified    Cellulitis and abscess of unspecified site    Epilepsy ([place])    Lump or mass in breast    Malignant neoplasm of upper-outer quadrant of female breast ([place])    diagnosed on 11-22-10; right breast cancer, invasive mammary cancer, 1.6 cm, Sentinel lymph node negative, ER/PR positive, HER-2/neu is not amplified, Oncotype Dx assay recurrent score 11 (7% chance of distant recurrence at 10 years)   Personal history of malignant neoplasm of breast    treated with right breast wide excision with sentinel node biopsy, mastoplasty, and radiation therapy   Personal history of radiation therapy 2012   right breast ca   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 Sentinel nodes x2  were negative for metastatic disease. Low Oncotype DX at 7%.   CHOLECYSTECTOMY  2005   COLONOSCOPY  06/01/2008   Loistine Simas, M.D. Normal exam.   COLONOSCOPY WITH PROPOFOL N/A 05/18/2019   Procedure: COLONOSCOPY WITH PROPOFOL;  Surgeon: Tol[place], Benay Pike, MD;  Location: [facility] ENDOSCOPY;  Service: Gastroenterology;  Laterality: N/A;   GANGLION CYST EXCISION  1980's   radiation therapy Right    breast cancer   WISDOM TOOTH EXTRACTION  1980's    FAMILY HISTORY :   Family History  Problem Relation Age of Onset   Leukemia Sister 54   Breast cancer Maternal Aunt    Testicular cancer Other        nephew, age late 6's    SOCIAL HISTORY:   Social History   Tobacco Use   Smoking status: Never   Smokeless tobacco: Never  Substance Use Topics   Alcohol use: No    Alcohol/week: 0.0 standard drinks   Drug use: No    ALLERGIES:  has No Known Allergies.  MEDICATIONS:  Current Outpatient Medications  Medication Sig Dispense Refill   calcium acetate (PHOSLO) 667 MG capsule Take 667 mg by mouth 3 (three) times daily with meals.     Calcium Carb-Cholecalciferol (CALCIUM 1000 + D PO) Take 1 tablet 3 (three) times daily by mouth.      Inulin-Calcium-Vitamin D (FIBERCHOICE PLUS CALCIUM PO) Take 1 tablet by mouth daily.      Multiple Vitamins-Minerals (PRESERVISION AREDS 2 PO) Take by mouth.     Nutritional Supplements (JUICE PLUS FIBRE PO) Take by mouth.     Omega-3 Fatty Acids (FISH OIL) 1000 MG CAPS Take 3 (three) times daily by mouth.      PREVIDENT 5000 SENSITIVE 1.1-5 % PSTE USE AS DIRECTED 1 TIME DAILY  3   Red Yeast Rice Extract (RED YEAST RICE PO) Take by mouth 2 (two) times daily.     No current facility-administered medications for this visit.    PHYSICAL EXAMINATION: ECOG PERFORMANCE STATUS: 0 - Asymptomatic  BP 136/84 (BP Location: Left Arm)   Pulse (!) 57   Temp (!) 97.4 F (36.3 C) (Tympanic)   Resp 17   Wt 146 lb 6.4 oz (66.4 kg)   SpO2 100%   BMI 25.93 kg/m    Filed Weights   10/25/20 1010  Weight: 146 lb 6.4 oz (66.4 kg)    Physical Exam Constitutional:      Appearance: Normal appearance.  HENT:     Head: Normocephalic and atraumatic.  Eyes:     Pupils: Pupils are equal, round, and reactive to light.  Cardiovascular:     Rate and Rhythm: Normal rate and regular rhythm.     Heart sounds: Normal heart sounds. No murmur heard. Pulmonary:     Effort: Pulmonary effort is normal.     Breath sounds: Normal breath sounds. No wheezing.  Chest:     Chest wall: Tenderness (Left sided breat discomfort at time per patient-nonreproducible) present. No mass or swelling.  Abdominal:     General: Bowel sounds are normal. There is no distension.     Palpations: Abdomen is soft.     Tenderness: There is no abdominal tenderness.  Musculoskeletal:  General: Normal range of motion.     Cervical back: Normal range of motion.  Skin:    General: Skin is warm and dry.     Findings: No rash.  Neurological:     Mental Status: She is alert and oriented to person, place, and time.     Gait: Gait is intact.  Psychiatric:        Mood and Affect: Mood and affect normal.        Cognition and Memory: Memory normal.        Judgment: Judgment normal.    LABORATORY DATA:  I have reviewed the data as listed    Component Value Date/Time   NA 140 10/25/2020 0942   K 3.8 10/25/2020 0942   CL 103 10/25/2020 0942   CO2 31 10/25/2020 0942   GLUCOSE 98 10/25/2020 0942   BUN 16 10/25/2020 0942   CREATININE 1.02 (H) 10/25/2020 0942   CREATININE 0.94 06/10/2013 1010   CALCIUM 10.1 10/25/2020 0942   PROT 7.0 10/25/2020 0942   PROT 7.1 06/10/2013 1010   ALBUMIN 4.2 10/25/2020 0942   ALBUMIN 3.9 06/10/2013 1010   AST 70 (H) 10/25/2020 0942   AST 60 (H) 06/10/2013 1010   ALT 64 (H) 10/25/2020 0942   ALT 85 (H) 06/10/2013 1010   ALKPHOS 75 10/25/2020 0942   ALKPHOS 125 (H) 06/10/2013 1010   BILITOT 1.1 10/25/2020 0942   BILITOT 0.4 06/10/2013 1010    GFRNONAA >60 10/25/2020 0942   GFRNONAA >60 06/10/2013 1010   GFRAA >60 10/26/2019 1007   GFRAA >60 06/10/2013 1010    No results found for: SPEP, UPEP  Lab Results  Component Value Date   WBC 4.4 10/25/2020   NEUTROABS 2.6 10/25/2020   HGB 14.4 10/25/2020   HCT 42.3 10/25/2020   MCV 90.6 10/25/2020   PLT 165 10/25/2020      Chemistry      Component Value Date/Time   NA 140 10/25/2020 0942   K 3.8 10/25/2020 0942   CL 103 10/25/2020 0942   CO2 31 10/25/2020 0942   BUN 16 10/25/2020 0942   CREATININE 1.02 (H) 10/25/2020 0942   CREATININE 0.94 06/10/2013 1010      Component Value Date/Time   CALCIUM 10.1 10/25/2020 0942   ALKPHOS 75 10/25/2020 0942   ALKPHOS 125 (H) 06/10/2013 1010   AST 70 (H) 10/25/2020 0942   AST 60 (H) 06/10/2013 1010   ALT 64 (H) 10/25/2020 0942   ALT 85 (H) 06/10/2013 1010   BILITOT 1.1 10/25/2020 0942   BILITOT 0.4 06/10/2013 1010       RADIOGRAPHIC STUDIES: I have personally reviewed the radiological images as listed and agreed with the findings in the report. No results found.   ASSESSMENT & PLAN: Mrs. Gowens is here for follow-up for right breast cancer and osteoporosis.   Right breast cancer- She completed 5 years of anastrozole in March 2018.  Oncotype was low risk.  She continues yearly mammograms last in November 2021 which was BI-RADS Category 1 negative.  Repeat in November 2022.  This is already been scheduled.  Clinically she is without evidence of recurrence.  She sees Dr. Ouida Sills yearly as well and is up-to-date on all of her gynecology exams.   Osteoporosis- Patient had bone density scan on 12/09/2019 which showed a T score of -2.7 which is worse from previous -1.7.  She is currently on calcium and vitamin D.  She wishes to not start bisphosphonate therapy at this  time.  Repeat bone density in 2023.  Elevated LFTs- This appears to be intermittent.  Labs from today show an AST of 70 and ALT of 64.  Denies any new  medications.  She is very active and eats a healthy diet.  Will defer to PCP for follow-up.  If persistently remains elevated would recommend ultrasound of her abdomen.   Disposition- She is scheduled for mammogram on 12/09/2020.  Continue calcium and vitamin D.  She will need repeat bone density in 2023.  Patient can be discharged from our clinic and be monitored by her PCP and gynecologist.  I spent 20 minutes dedicated to the care of this patient (face-to-face and non-face-to-face) on the date of the encounter to include what is described in the assessment and plan.   No problem-specific Assessment & Plan notes found for this encounter.  No orders of the defined types were placed in this encounter.     Jacquelin Hawking, NP 10/25/2020 10:43 AM

## 2020-11-23 DIAGNOSIS — M5136 Other intervertebral disc degeneration, lumbar region: Secondary | ICD-10-CM | POA: Diagnosis not present

## 2020-11-23 DIAGNOSIS — M9901 Segmental and somatic dysfunction of cervical region: Secondary | ICD-10-CM | POA: Diagnosis not present

## 2020-11-23 DIAGNOSIS — M9903 Segmental and somatic dysfunction of lumbar region: Secondary | ICD-10-CM | POA: Diagnosis not present

## 2020-11-23 DIAGNOSIS — M62830 Muscle spasm of back: Secondary | ICD-10-CM | POA: Diagnosis not present

## 2020-12-09 ENCOUNTER — Ambulatory Visit
Admission: RE | Admit: 2020-12-09 | Discharge: 2020-12-09 | Disposition: A | Discharge status: Home / Self Care | Payer: 59 | Source: Ambulatory Visit | Attending: Obstetrics and Gynecology | Admitting: Obstetrics and Gynecology

## 2020-12-09 ENCOUNTER — Other Ambulatory Visit: Payer: Self-pay

## 2020-12-09 DIAGNOSIS — Z1231 Encounter for screening mammogram for malignant neoplasm of breast: Secondary | ICD-10-CM | POA: Insufficient documentation

## 2020-12-14 DIAGNOSIS — M9903 Segmental and somatic dysfunction of lumbar region: Secondary | ICD-10-CM | POA: Diagnosis not present

## 2020-12-14 DIAGNOSIS — M62830 Muscle spasm of back: Secondary | ICD-10-CM | POA: Diagnosis not present

## 2020-12-14 DIAGNOSIS — M9901 Segmental and somatic dysfunction of cervical region: Secondary | ICD-10-CM | POA: Diagnosis not present

## 2020-12-14 DIAGNOSIS — M5136 Other intervertebral disc degeneration, lumbar region: Secondary | ICD-10-CM | POA: Diagnosis not present

## 2021-01-04 DIAGNOSIS — M9901 Segmental and somatic dysfunction of cervical region: Secondary | ICD-10-CM | POA: Diagnosis not present

## 2021-01-04 DIAGNOSIS — M5136 Other intervertebral disc degeneration, lumbar region: Secondary | ICD-10-CM | POA: Diagnosis not present

## 2021-01-04 DIAGNOSIS — M62830 Muscle spasm of back: Secondary | ICD-10-CM | POA: Diagnosis not present

## 2021-01-04 DIAGNOSIS — M9903 Segmental and somatic dysfunction of lumbar region: Secondary | ICD-10-CM | POA: Diagnosis not present

## 2021-01-09 DIAGNOSIS — Z0000 Encounter for general adult medical examination without abnormal findings: Secondary | ICD-10-CM | POA: Diagnosis not present

## 2021-01-09 DIAGNOSIS — E785 Hyperlipidemia, unspecified: Secondary | ICD-10-CM | POA: Diagnosis not present

## 2021-01-16 DIAGNOSIS — E785 Hyperlipidemia, unspecified: Secondary | ICD-10-CM | POA: Diagnosis not present

## 2021-01-16 DIAGNOSIS — Z23 Encounter for immunization: Secondary | ICD-10-CM | POA: Diagnosis not present

## 2021-01-16 DIAGNOSIS — M8580 Other specified disorders of bone density and structure, unspecified site: Secondary | ICD-10-CM | POA: Diagnosis not present

## 2021-01-25 DIAGNOSIS — M5136 Other intervertebral disc degeneration, lumbar region: Secondary | ICD-10-CM | POA: Diagnosis not present

## 2021-01-25 DIAGNOSIS — M9901 Segmental and somatic dysfunction of cervical region: Secondary | ICD-10-CM | POA: Diagnosis not present

## 2021-01-25 DIAGNOSIS — M9903 Segmental and somatic dysfunction of lumbar region: Secondary | ICD-10-CM | POA: Diagnosis not present

## 2021-01-25 DIAGNOSIS — M62830 Muscle spasm of back: Secondary | ICD-10-CM | POA: Diagnosis not present

## 2021-02-15 DIAGNOSIS — M62830 Muscle spasm of back: Secondary | ICD-10-CM | POA: Diagnosis not present

## 2021-02-15 DIAGNOSIS — M9903 Segmental and somatic dysfunction of lumbar region: Secondary | ICD-10-CM | POA: Diagnosis not present

## 2021-02-15 DIAGNOSIS — M5136 Other intervertebral disc degeneration, lumbar region: Secondary | ICD-10-CM | POA: Diagnosis not present

## 2021-02-15 DIAGNOSIS — M9901 Segmental and somatic dysfunction of cervical region: Secondary | ICD-10-CM | POA: Diagnosis not present

## 2021-03-08 DIAGNOSIS — M9903 Segmental and somatic dysfunction of lumbar region: Secondary | ICD-10-CM | POA: Diagnosis not present

## 2021-03-08 DIAGNOSIS — M62830 Muscle spasm of back: Secondary | ICD-10-CM | POA: Diagnosis not present

## 2021-03-08 DIAGNOSIS — M5136 Other intervertebral disc degeneration, lumbar region: Secondary | ICD-10-CM | POA: Diagnosis not present

## 2021-03-08 DIAGNOSIS — M9901 Segmental and somatic dysfunction of cervical region: Secondary | ICD-10-CM | POA: Diagnosis not present

## 2021-03-29 DIAGNOSIS — M9903 Segmental and somatic dysfunction of lumbar region: Secondary | ICD-10-CM | POA: Diagnosis not present

## 2021-03-29 DIAGNOSIS — M9901 Segmental and somatic dysfunction of cervical region: Secondary | ICD-10-CM | POA: Diagnosis not present

## 2021-03-29 DIAGNOSIS — M5136 Other intervertebral disc degeneration, lumbar region: Secondary | ICD-10-CM | POA: Diagnosis not present

## 2021-03-29 DIAGNOSIS — M62830 Muscle spasm of back: Secondary | ICD-10-CM | POA: Diagnosis not present

## 2021-04-06 IMAGING — MG DIGITAL SCREENING BILAT W/ TOMO W/ CAD
8 series · 8 of 24 positions shown · non-contrast
Comparison: Previous exam(s).

CLINICAL DATA: Screening.

EXAM:
DIGITAL SCREENING BILATERAL MAMMOGRAM WITH TOMO AND CAD

[R CC synth-2D]
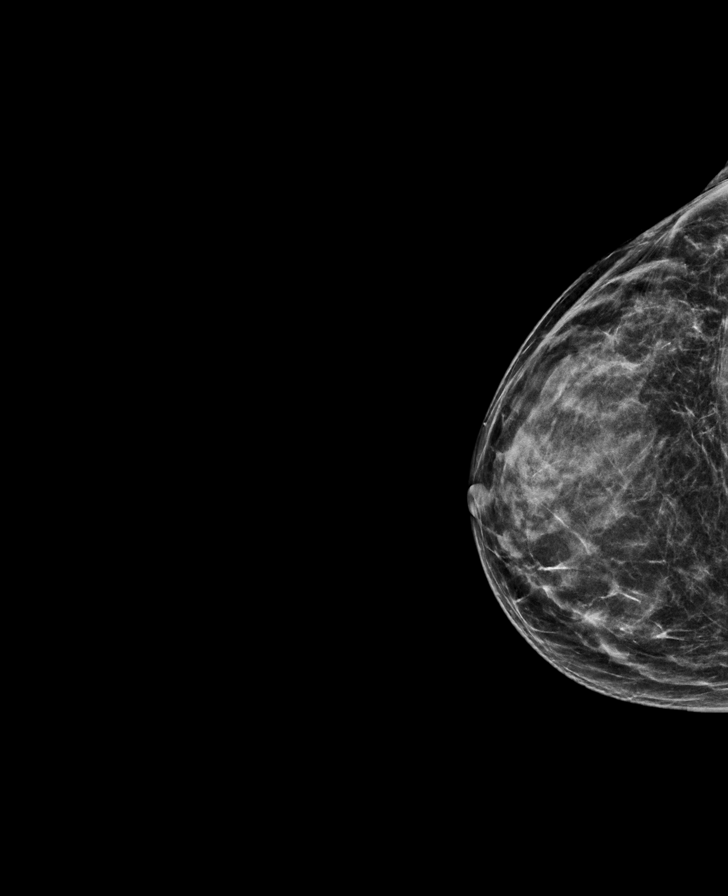

[R MLO synth-2D]
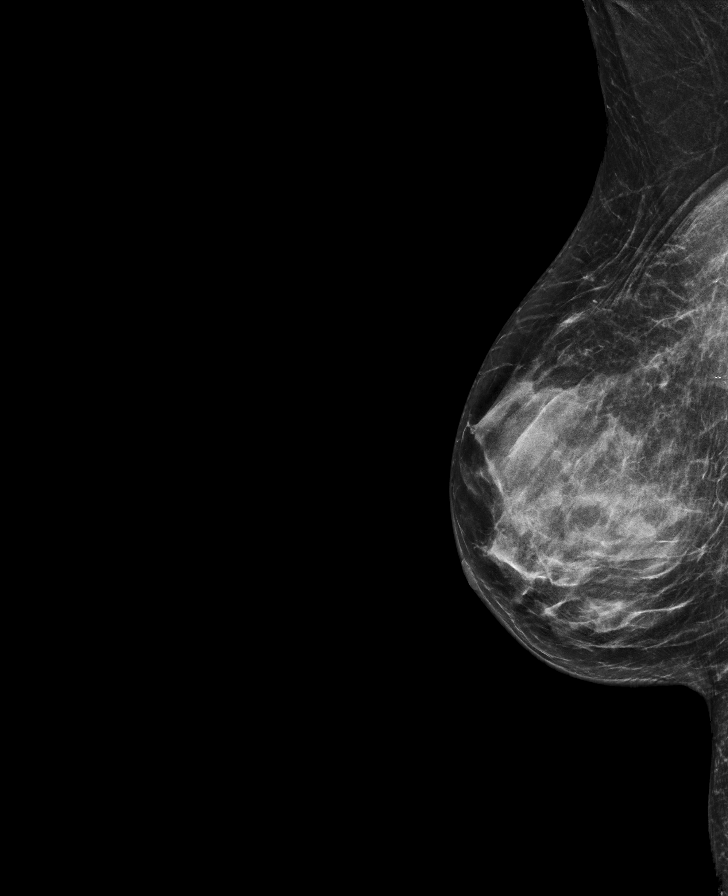

[L MLO synth-2D]
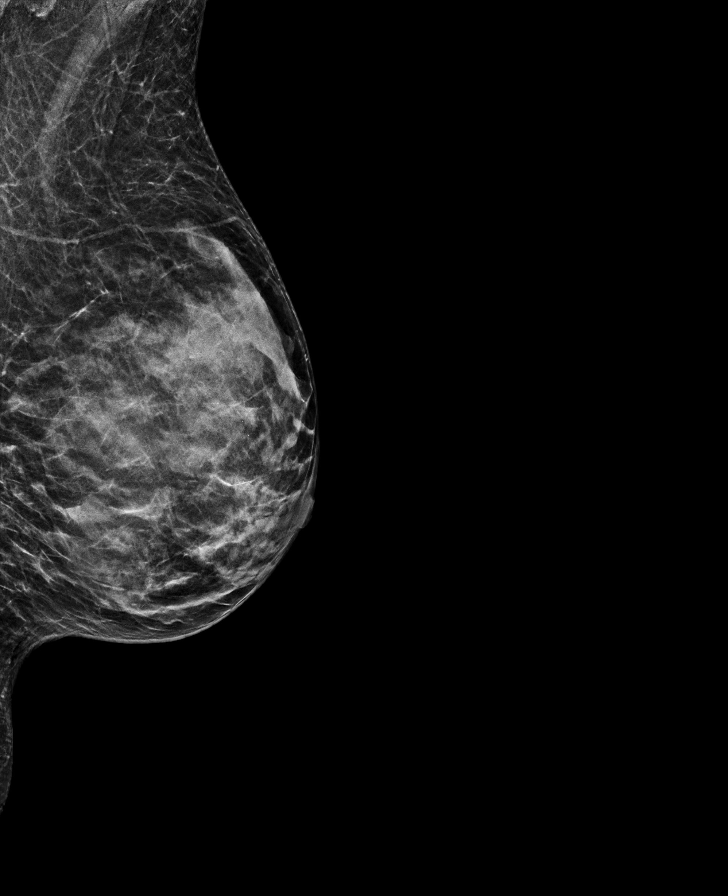

[L CC synth-2D]
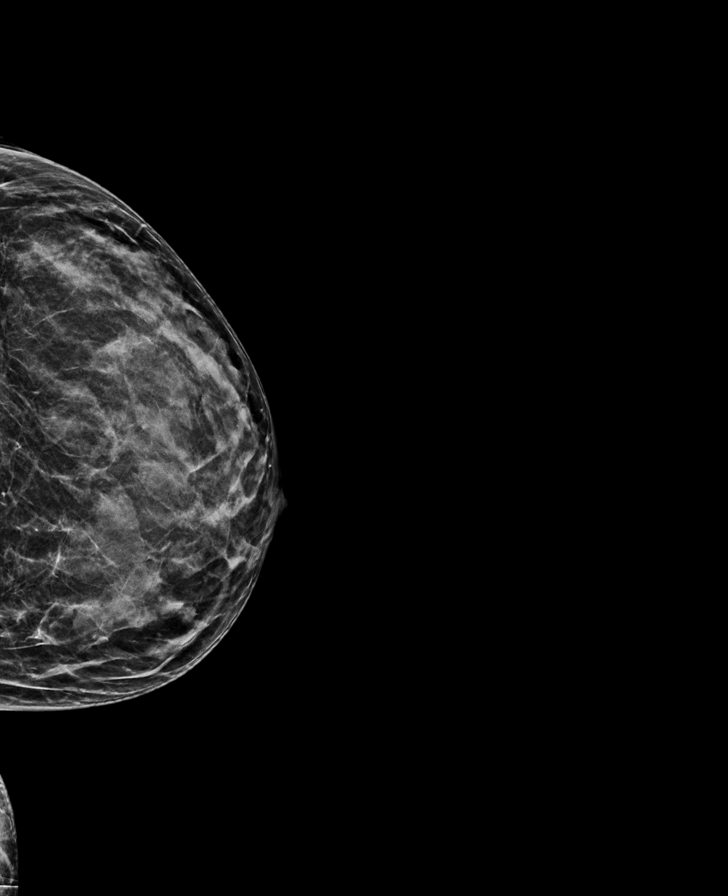

[L CC tomo · tomo slice 21/40.0]
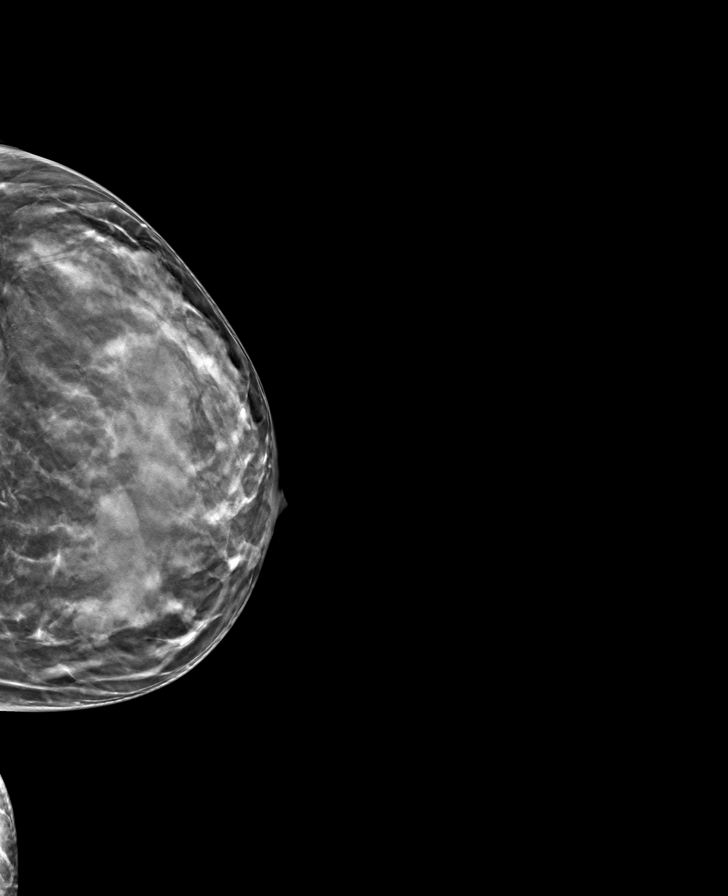

[R CC tomo · tomo slice 27/52.0]
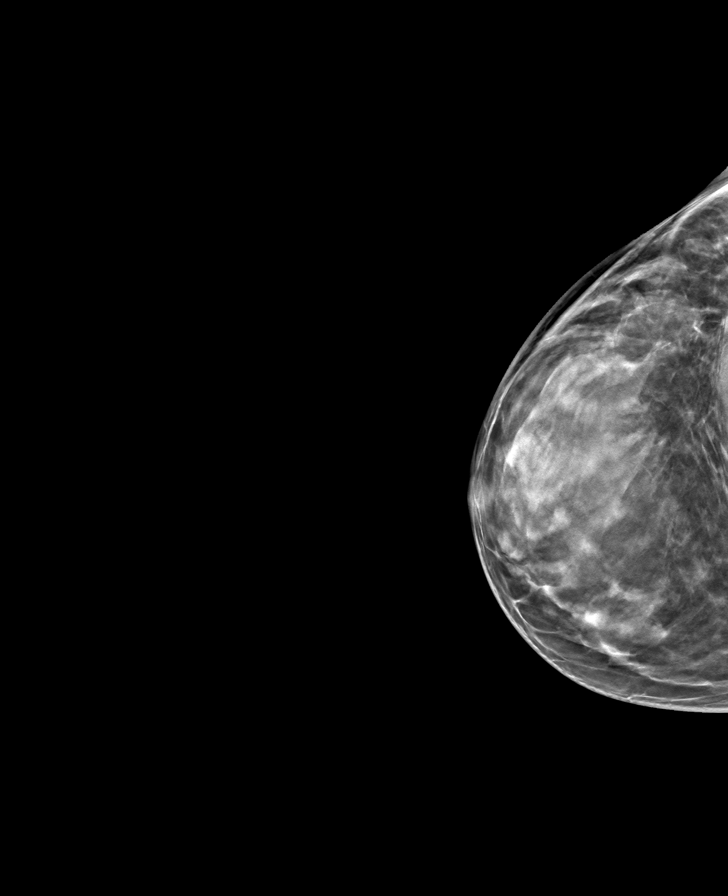

[L MLO tomo · tomo slice 20/39.0]
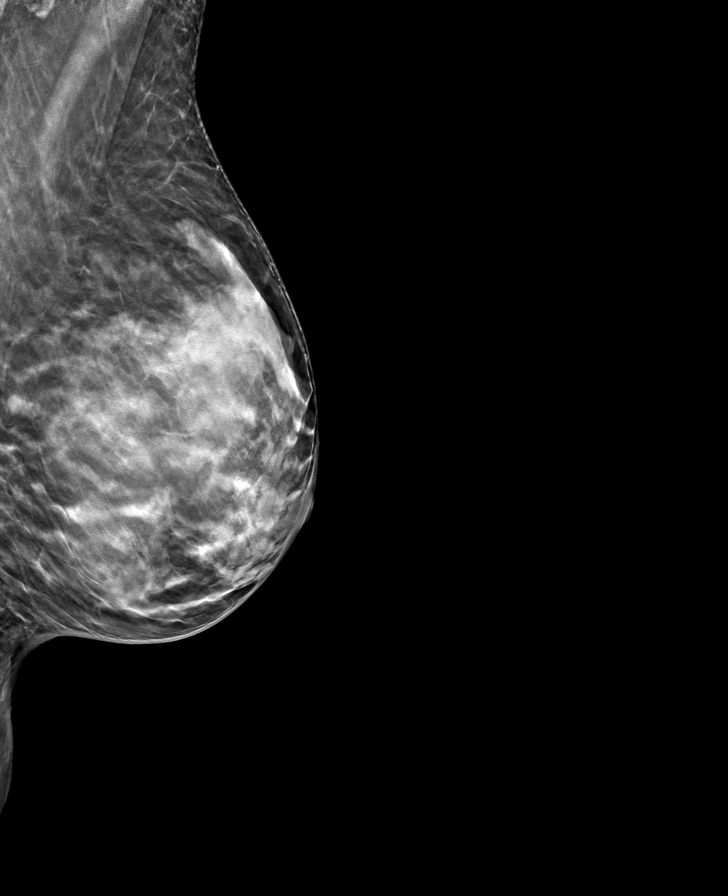

[R MLO tomo · tomo slice 28/55.0]
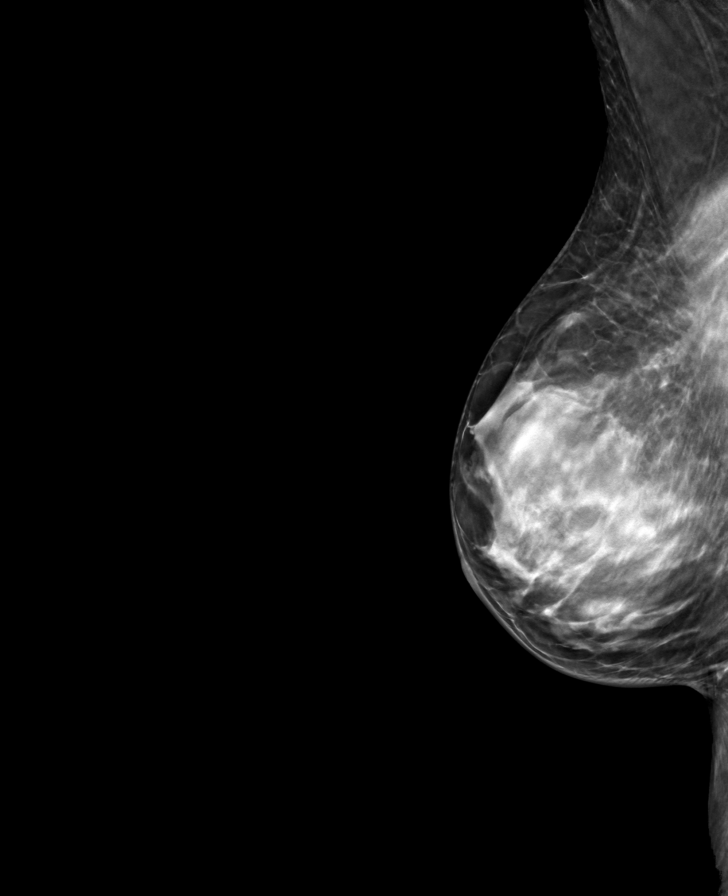

[8 of 24 positions shown; findings below may reference images not displayed]

ACR Breast Density Category c: The breast tissue is heterogeneously
dense, which may obscure small masses.
FINDINGS: There are no findings suspicious for malignancy. Images were
processed with CAD.
IMPRESSION: No mammographic evidence of malignancy. A result letter of this
screening mammogram will be mailed directly to the patient.

RECOMMENDATION:
Screening mammogram in one year. (Code:[id number])

BI-RADS CATEGORY  1: Negative.

## 2021-04-26 DIAGNOSIS — M9901 Segmental and somatic dysfunction of cervical region: Secondary | ICD-10-CM | POA: Diagnosis not present

## 2021-04-26 DIAGNOSIS — M5136 Other intervertebral disc degeneration, lumbar region: Secondary | ICD-10-CM | POA: Diagnosis not present

## 2021-04-26 DIAGNOSIS — M9903 Segmental and somatic dysfunction of lumbar region: Secondary | ICD-10-CM | POA: Diagnosis not present

## 2021-04-26 DIAGNOSIS — M62830 Muscle spasm of back: Secondary | ICD-10-CM | POA: Diagnosis not present

## 2021-05-24 DIAGNOSIS — M62830 Muscle spasm of back: Secondary | ICD-10-CM | POA: Diagnosis not present

## 2021-05-24 DIAGNOSIS — M5136 Other intervertebral disc degeneration, lumbar region: Secondary | ICD-10-CM | POA: Diagnosis not present

## 2021-05-24 DIAGNOSIS — M9903 Segmental and somatic dysfunction of lumbar region: Secondary | ICD-10-CM | POA: Diagnosis not present

## 2021-05-24 DIAGNOSIS — M9901 Segmental and somatic dysfunction of cervical region: Secondary | ICD-10-CM | POA: Diagnosis not present

## 2021-06-19 DIAGNOSIS — H524 Presbyopia: Secondary | ICD-10-CM | POA: Diagnosis not present

## 2021-06-19 DIAGNOSIS — H353131 Nonexudative age-related macular degeneration, bilateral, early dry stage: Secondary | ICD-10-CM | POA: Diagnosis not present

## 2021-06-28 DIAGNOSIS — M9901 Segmental and somatic dysfunction of cervical region: Secondary | ICD-10-CM | POA: Diagnosis not present

## 2021-06-28 DIAGNOSIS — M9903 Segmental and somatic dysfunction of lumbar region: Secondary | ICD-10-CM | POA: Diagnosis not present

## 2021-06-28 DIAGNOSIS — M5136 Other intervertebral disc degeneration, lumbar region: Secondary | ICD-10-CM | POA: Diagnosis not present

## 2021-06-28 DIAGNOSIS — M62830 Muscle spasm of back: Secondary | ICD-10-CM | POA: Diagnosis not present

## 2021-07-14 DIAGNOSIS — E785 Hyperlipidemia, unspecified: Secondary | ICD-10-CM | POA: Diagnosis not present

## 2021-07-15 LAB — LAB REPORT - SCANNED: EGFR: 53

## 2021-07-26 DIAGNOSIS — M9901 Segmental and somatic dysfunction of cervical region: Secondary | ICD-10-CM | POA: Diagnosis not present

## 2021-07-26 DIAGNOSIS — M9903 Segmental and somatic dysfunction of lumbar region: Secondary | ICD-10-CM | POA: Diagnosis not present

## 2021-07-26 DIAGNOSIS — M5136 Other intervertebral disc degeneration, lumbar region: Secondary | ICD-10-CM | POA: Diagnosis not present

## 2021-07-26 DIAGNOSIS — M62830 Muscle spasm of back: Secondary | ICD-10-CM | POA: Diagnosis not present

## 2021-09-04 ENCOUNTER — Other Ambulatory Visit: Payer: Self-pay | Admitting: Obstetrics and Gynecology

## 2021-09-04 DIAGNOSIS — Z1211 Encounter for screening for malignant neoplasm of colon: Secondary | ICD-10-CM | POA: Diagnosis not present

## 2021-09-04 DIAGNOSIS — Z01419 Encounter for gynecological examination (general) (routine) without abnormal findings: Secondary | ICD-10-CM | POA: Diagnosis not present

## 2021-09-04 DIAGNOSIS — Z1231 Encounter for screening mammogram for malignant neoplasm of breast: Secondary | ICD-10-CM

## 2021-09-04 DIAGNOSIS — Z124 Encounter for screening for malignant neoplasm of cervix: Secondary | ICD-10-CM | POA: Diagnosis not present

## 2021-09-12 DIAGNOSIS — Z1211 Encounter for screening for malignant neoplasm of colon: Secondary | ICD-10-CM | POA: Diagnosis not present

## 2021-09-20 DIAGNOSIS — M62830 Muscle spasm of back: Secondary | ICD-10-CM | POA: Diagnosis not present

## 2021-09-20 DIAGNOSIS — M9903 Segmental and somatic dysfunction of lumbar region: Secondary | ICD-10-CM | POA: Diagnosis not present

## 2021-09-20 DIAGNOSIS — M5136 Other intervertebral disc degeneration, lumbar region: Secondary | ICD-10-CM | POA: Diagnosis not present

## 2021-09-20 DIAGNOSIS — M9901 Segmental and somatic dysfunction of cervical region: Secondary | ICD-10-CM | POA: Diagnosis not present

## 2021-10-25 DIAGNOSIS — M9901 Segmental and somatic dysfunction of cervical region: Secondary | ICD-10-CM | POA: Diagnosis not present

## 2021-10-25 DIAGNOSIS — M9903 Segmental and somatic dysfunction of lumbar region: Secondary | ICD-10-CM | POA: Diagnosis not present

## 2021-10-25 DIAGNOSIS — M5136 Other intervertebral disc degeneration, lumbar region: Secondary | ICD-10-CM | POA: Diagnosis not present

## 2021-10-25 DIAGNOSIS — M62830 Muscle spasm of back: Secondary | ICD-10-CM | POA: Diagnosis not present

## 2021-10-26 DIAGNOSIS — H353131 Nonexudative age-related macular degeneration, bilateral, early dry stage: Secondary | ICD-10-CM | POA: Diagnosis not present

## 2021-11-29 DIAGNOSIS — M62830 Muscle spasm of back: Secondary | ICD-10-CM | POA: Diagnosis not present

## 2021-11-29 DIAGNOSIS — M5136 Other intervertebral disc degeneration, lumbar region: Secondary | ICD-10-CM | POA: Diagnosis not present

## 2021-11-29 DIAGNOSIS — M9903 Segmental and somatic dysfunction of lumbar region: Secondary | ICD-10-CM | POA: Diagnosis not present

## 2021-11-29 DIAGNOSIS — M9901 Segmental and somatic dysfunction of cervical region: Secondary | ICD-10-CM | POA: Diagnosis not present

## 2021-12-27 DIAGNOSIS — M9903 Segmental and somatic dysfunction of lumbar region: Secondary | ICD-10-CM | POA: Diagnosis not present

## 2021-12-27 DIAGNOSIS — M5136 Other intervertebral disc degeneration, lumbar region: Secondary | ICD-10-CM | POA: Diagnosis not present

## 2021-12-27 DIAGNOSIS — M62830 Muscle spasm of back: Secondary | ICD-10-CM | POA: Diagnosis not present

## 2021-12-27 DIAGNOSIS — M9901 Segmental and somatic dysfunction of cervical region: Secondary | ICD-10-CM | POA: Diagnosis not present

## 2022-01-01 ENCOUNTER — Ambulatory Visit
Admission: RE | Admit: 2022-01-01 | Discharge: 2022-01-01 | Disposition: A | Discharge status: Home / Self Care | Payer: 59 | Source: Ambulatory Visit | Attending: Obstetrics and Gynecology | Admitting: Obstetrics and Gynecology

## 2022-01-01 DIAGNOSIS — Z1231 Encounter for screening mammogram for malignant neoplasm of breast: Secondary | ICD-10-CM | POA: Insufficient documentation

## 2022-01-12 DIAGNOSIS — E785 Hyperlipidemia, unspecified: Secondary | ICD-10-CM | POA: Diagnosis not present

## 2022-01-12 DIAGNOSIS — M8580 Other specified disorders of bone density and structure, unspecified site: Secondary | ICD-10-CM | POA: Diagnosis not present

## 2022-01-13 LAB — LAB REPORT - SCANNED
EGFR: 57
TSH: 2.22 (ref 0.41–5.90)

## 2022-01-17 DIAGNOSIS — E785 Hyperlipidemia, unspecified: Secondary | ICD-10-CM | POA: Diagnosis not present

## 2022-01-31 DIAGNOSIS — M62830 Muscle spasm of back: Secondary | ICD-10-CM | POA: Diagnosis not present

## 2022-01-31 DIAGNOSIS — M9903 Segmental and somatic dysfunction of lumbar region: Secondary | ICD-10-CM | POA: Diagnosis not present

## 2022-01-31 DIAGNOSIS — M9901 Segmental and somatic dysfunction of cervical region: Secondary | ICD-10-CM | POA: Diagnosis not present

## 2022-01-31 DIAGNOSIS — M5136 Other intervertebral disc degeneration, lumbar region: Secondary | ICD-10-CM | POA: Diagnosis not present

## 2022-02-28 DIAGNOSIS — M5136 Other intervertebral disc degeneration, lumbar region: Secondary | ICD-10-CM | POA: Diagnosis not present

## 2022-02-28 DIAGNOSIS — M62830 Muscle spasm of back: Secondary | ICD-10-CM | POA: Diagnosis not present

## 2022-02-28 DIAGNOSIS — M9903 Segmental and somatic dysfunction of lumbar region: Secondary | ICD-10-CM | POA: Diagnosis not present

## 2022-02-28 DIAGNOSIS — M9901 Segmental and somatic dysfunction of cervical region: Secondary | ICD-10-CM | POA: Diagnosis not present

## 2022-03-28 DIAGNOSIS — M62830 Muscle spasm of back: Secondary | ICD-10-CM | POA: Diagnosis not present

## 2022-03-28 DIAGNOSIS — M5136 Other intervertebral disc degeneration, lumbar region: Secondary | ICD-10-CM | POA: Diagnosis not present

## 2022-03-28 DIAGNOSIS — M9903 Segmental and somatic dysfunction of lumbar region: Secondary | ICD-10-CM | POA: Diagnosis not present

## 2022-03-28 DIAGNOSIS — M9901 Segmental and somatic dysfunction of cervical region: Secondary | ICD-10-CM | POA: Diagnosis not present

## 2022-04-04 DIAGNOSIS — D2271 Melanocytic nevi of right lower limb, including hip: Secondary | ICD-10-CM | POA: Diagnosis not present

## 2022-04-04 DIAGNOSIS — D2272 Melanocytic nevi of left lower limb, including hip: Secondary | ICD-10-CM | POA: Diagnosis not present

## 2022-04-04 DIAGNOSIS — L821 Other seborrheic keratosis: Secondary | ICD-10-CM | POA: Diagnosis not present

## 2022-04-04 DIAGNOSIS — D485 Neoplasm of uncertain behavior of skin: Secondary | ICD-10-CM | POA: Diagnosis not present

## 2022-04-04 DIAGNOSIS — D2262 Melanocytic nevi of left upper limb, including shoulder: Secondary | ICD-10-CM | POA: Diagnosis not present

## 2022-04-04 DIAGNOSIS — D225 Melanocytic nevi of trunk: Secondary | ICD-10-CM | POA: Diagnosis not present

## 2022-04-04 DIAGNOSIS — D2261 Melanocytic nevi of right upper limb, including shoulder: Secondary | ICD-10-CM | POA: Diagnosis not present

## 2022-04-04 DIAGNOSIS — L988 Other specified disorders of the skin and subcutaneous tissue: Secondary | ICD-10-CM | POA: Diagnosis not present

## 2022-04-24 DIAGNOSIS — H353131 Nonexudative age-related macular degeneration, bilateral, early dry stage: Secondary | ICD-10-CM | POA: Diagnosis not present

## 2022-04-24 DIAGNOSIS — H52213 Irregular astigmatism, bilateral: Secondary | ICD-10-CM | POA: Diagnosis not present

## 2022-04-25 DIAGNOSIS — M62830 Muscle spasm of back: Secondary | ICD-10-CM | POA: Diagnosis not present

## 2022-04-25 DIAGNOSIS — M5136 Other intervertebral disc degeneration, lumbar region: Secondary | ICD-10-CM | POA: Diagnosis not present

## 2022-04-25 DIAGNOSIS — M9901 Segmental and somatic dysfunction of cervical region: Secondary | ICD-10-CM | POA: Diagnosis not present

## 2022-04-25 DIAGNOSIS — M9903 Segmental and somatic dysfunction of lumbar region: Secondary | ICD-10-CM | POA: Diagnosis not present

## 2022-05-23 DIAGNOSIS — M9901 Segmental and somatic dysfunction of cervical region: Secondary | ICD-10-CM | POA: Diagnosis not present

## 2022-05-23 DIAGNOSIS — M62830 Muscle spasm of back: Secondary | ICD-10-CM | POA: Diagnosis not present

## 2022-05-23 DIAGNOSIS — M5136 Other intervertebral disc degeneration, lumbar region: Secondary | ICD-10-CM | POA: Diagnosis not present

## 2022-05-23 DIAGNOSIS — M9903 Segmental and somatic dysfunction of lumbar region: Secondary | ICD-10-CM | POA: Diagnosis not present

## 2022-06-20 DIAGNOSIS — M5136 Other intervertebral disc degeneration, lumbar region: Secondary | ICD-10-CM | POA: Diagnosis not present

## 2022-06-20 DIAGNOSIS — M9901 Segmental and somatic dysfunction of cervical region: Secondary | ICD-10-CM | POA: Diagnosis not present

## 2022-06-20 DIAGNOSIS — M9903 Segmental and somatic dysfunction of lumbar region: Secondary | ICD-10-CM | POA: Diagnosis not present

## 2022-06-20 DIAGNOSIS — M62830 Muscle spasm of back: Secondary | ICD-10-CM | POA: Diagnosis not present

## 2022-07-18 DIAGNOSIS — M5136 Other intervertebral disc degeneration, lumbar region: Secondary | ICD-10-CM | POA: Diagnosis not present

## 2022-07-18 DIAGNOSIS — M62830 Muscle spasm of back: Secondary | ICD-10-CM | POA: Diagnosis not present

## 2022-07-18 DIAGNOSIS — M9901 Segmental and somatic dysfunction of cervical region: Secondary | ICD-10-CM | POA: Diagnosis not present

## 2022-07-18 DIAGNOSIS — M9903 Segmental and somatic dysfunction of lumbar region: Secondary | ICD-10-CM | POA: Diagnosis not present

## 2022-07-27 DIAGNOSIS — E785 Hyperlipidemia, unspecified: Secondary | ICD-10-CM | POA: Diagnosis not present

## 2022-07-27 LAB — LAB REPORT - SCANNED: EGFR: 53

## 2022-08-15 DIAGNOSIS — M62830 Muscle spasm of back: Secondary | ICD-10-CM | POA: Diagnosis not present

## 2022-08-15 DIAGNOSIS — M9901 Segmental and somatic dysfunction of cervical region: Secondary | ICD-10-CM | POA: Diagnosis not present

## 2022-08-15 DIAGNOSIS — M5136 Other intervertebral disc degeneration, lumbar region: Secondary | ICD-10-CM | POA: Diagnosis not present

## 2022-08-15 DIAGNOSIS — M9903 Segmental and somatic dysfunction of lumbar region: Secondary | ICD-10-CM | POA: Diagnosis not present

## 2022-09-11 ENCOUNTER — Other Ambulatory Visit: Payer: Self-pay | Admitting: Obstetrics and Gynecology

## 2022-09-11 DIAGNOSIS — Z1331 Encounter for screening for depression: Secondary | ICD-10-CM | POA: Diagnosis not present

## 2022-09-11 DIAGNOSIS — Z1231 Encounter for screening mammogram for malignant neoplasm of breast: Secondary | ICD-10-CM

## 2022-09-11 DIAGNOSIS — Z01419 Encounter for gynecological examination (general) (routine) without abnormal findings: Secondary | ICD-10-CM | POA: Diagnosis not present

## 2022-09-12 DIAGNOSIS — M5136 Other intervertebral disc degeneration, lumbar region: Secondary | ICD-10-CM | POA: Diagnosis not present

## 2022-09-12 DIAGNOSIS — M9903 Segmental and somatic dysfunction of lumbar region: Secondary | ICD-10-CM | POA: Diagnosis not present

## 2022-09-12 DIAGNOSIS — M9901 Segmental and somatic dysfunction of cervical region: Secondary | ICD-10-CM | POA: Diagnosis not present

## 2022-09-12 DIAGNOSIS — M62830 Muscle spasm of back: Secondary | ICD-10-CM | POA: Diagnosis not present

## 2022-09-26 DIAGNOSIS — Z1211 Encounter for screening for malignant neoplasm of colon: Secondary | ICD-10-CM | POA: Diagnosis not present

## 2022-10-10 DIAGNOSIS — M9903 Segmental and somatic dysfunction of lumbar region: Secondary | ICD-10-CM | POA: Diagnosis not present

## 2022-10-10 DIAGNOSIS — M62830 Muscle spasm of back: Secondary | ICD-10-CM | POA: Diagnosis not present

## 2022-10-10 DIAGNOSIS — M5136 Other intervertebral disc degeneration, lumbar region: Secondary | ICD-10-CM | POA: Diagnosis not present

## 2022-10-10 DIAGNOSIS — M9901 Segmental and somatic dysfunction of cervical region: Secondary | ICD-10-CM | POA: Diagnosis not present

## 2022-10-31 DIAGNOSIS — M62830 Muscle spasm of back: Secondary | ICD-10-CM | POA: Diagnosis not present

## 2022-10-31 DIAGNOSIS — M5136 Other intervertebral disc degeneration, lumbar region with discogenic back pain only: Secondary | ICD-10-CM | POA: Diagnosis not present

## 2022-10-31 DIAGNOSIS — M9903 Segmental and somatic dysfunction of lumbar region: Secondary | ICD-10-CM | POA: Diagnosis not present

## 2022-10-31 DIAGNOSIS — M9901 Segmental and somatic dysfunction of cervical region: Secondary | ICD-10-CM | POA: Diagnosis not present

## 2022-11-27 DIAGNOSIS — H353131 Nonexudative age-related macular degeneration, bilateral, early dry stage: Secondary | ICD-10-CM | POA: Diagnosis not present

## 2022-12-05 DIAGNOSIS — M62830 Muscle spasm of back: Secondary | ICD-10-CM | POA: Diagnosis not present

## 2022-12-05 DIAGNOSIS — M9903 Segmental and somatic dysfunction of lumbar region: Secondary | ICD-10-CM | POA: Diagnosis not present

## 2022-12-05 DIAGNOSIS — M9901 Segmental and somatic dysfunction of cervical region: Secondary | ICD-10-CM | POA: Diagnosis not present

## 2022-12-05 DIAGNOSIS — M5136 Other intervertebral disc degeneration, lumbar region with discogenic back pain only: Secondary | ICD-10-CM | POA: Diagnosis not present

## 2023-01-02 DIAGNOSIS — M9903 Segmental and somatic dysfunction of lumbar region: Secondary | ICD-10-CM | POA: Diagnosis not present

## 2023-01-02 DIAGNOSIS — M62830 Muscle spasm of back: Secondary | ICD-10-CM | POA: Diagnosis not present

## 2023-01-02 DIAGNOSIS — M5136 Other intervertebral disc degeneration, lumbar region with discogenic back pain only: Secondary | ICD-10-CM | POA: Diagnosis not present

## 2023-01-02 DIAGNOSIS — M9901 Segmental and somatic dysfunction of cervical region: Secondary | ICD-10-CM | POA: Diagnosis not present

## 2023-01-04 DIAGNOSIS — E785 Hyperlipidemia, unspecified: Secondary | ICD-10-CM | POA: Diagnosis not present

## 2023-01-04 DIAGNOSIS — M8580 Other specified disorders of bone density and structure, unspecified site: Secondary | ICD-10-CM | POA: Diagnosis not present

## 2023-01-05 LAB — LAB REPORT - SCANNED
EGFR: 58
TSH: 2.4 (ref 0.41–5.90)

## 2023-01-07 ENCOUNTER — Ambulatory Visit
Admission: RE | Admit: 2023-01-07 | Discharge: 2023-01-07 | Disposition: A | Discharge status: Home / Self Care | Payer: 59 | Source: Ambulatory Visit | Attending: Obstetrics and Gynecology | Admitting: Obstetrics and Gynecology

## 2023-01-07 DIAGNOSIS — Z1231 Encounter for screening mammogram for malignant neoplasm of breast: Secondary | ICD-10-CM | POA: Diagnosis not present

## 2023-01-09 DIAGNOSIS — E785 Hyperlipidemia, unspecified: Secondary | ICD-10-CM | POA: Diagnosis not present

## 2023-01-09 DIAGNOSIS — D0590 Unspecified type of carcinoma in situ of unspecified breast: Secondary | ICD-10-CM | POA: Diagnosis not present

## 2023-01-09 DIAGNOSIS — Z0000 Encounter for general adult medical examination without abnormal findings: Secondary | ICD-10-CM | POA: Diagnosis not present

## 2023-01-21 ENCOUNTER — Other Ambulatory Visit: Payer: Self-pay | Admitting: Internal Medicine

## 2023-01-21 DIAGNOSIS — M8580 Other specified disorders of bone density and structure, unspecified site: Secondary | ICD-10-CM

## 2023-02-06 DIAGNOSIS — M9903 Segmental and somatic dysfunction of lumbar region: Secondary | ICD-10-CM | POA: Diagnosis not present

## 2023-02-06 DIAGNOSIS — M5136 Other intervertebral disc degeneration, lumbar region with discogenic back pain only: Secondary | ICD-10-CM | POA: Diagnosis not present

## 2023-02-06 DIAGNOSIS — M62830 Muscle spasm of back: Secondary | ICD-10-CM | POA: Diagnosis not present

## 2023-02-06 DIAGNOSIS — M9901 Segmental and somatic dysfunction of cervical region: Secondary | ICD-10-CM | POA: Diagnosis not present

## 2023-06-19 ENCOUNTER — Other Ambulatory Visit: Payer: Self-pay | Admitting: Internal Medicine

## 2023-06-19 DIAGNOSIS — M8580 Other specified disorders of bone density and structure, unspecified site: Secondary | ICD-10-CM

## 2023-06-19 DIAGNOSIS — Z780 Asymptomatic menopausal state: Secondary | ICD-10-CM

## 2023-06-29 LAB — LAB REPORT - SCANNED: EGFR: 54

## 2023-08-06 ENCOUNTER — Ambulatory Visit
Admission: RE | Admit: 2023-08-06 | Discharge: 2023-08-06 | Disposition: A | Discharge status: Home / Self Care | Source: Ambulatory Visit | Attending: Internal Medicine | Admitting: Internal Medicine

## 2023-08-06 DIAGNOSIS — Z780 Asymptomatic menopausal state: Secondary | ICD-10-CM | POA: Insufficient documentation

## 2023-08-06 DIAGNOSIS — M8580 Other specified disorders of bone density and structure, unspecified site: Secondary | ICD-10-CM | POA: Insufficient documentation

## 2023-08-27 ENCOUNTER — Ambulatory Visit: Admitting: Family Medicine

## 2023-08-27 ENCOUNTER — Encounter: Payer: Self-pay | Admitting: Family Medicine

## 2023-08-27 VITALS — BP 122/75 | HR 58 | Ht 64.0 in | Wt 166.2 lb

## 2023-08-27 DIAGNOSIS — E782 Mixed hyperlipidemia: Secondary | ICD-10-CM | POA: Diagnosis not present

## 2023-08-27 DIAGNOSIS — Z23 Encounter for immunization: Secondary | ICD-10-CM

## 2023-08-27 DIAGNOSIS — N1831 Chronic kidney disease, stage 3a: Secondary | ICD-10-CM | POA: Diagnosis not present

## 2023-08-27 DIAGNOSIS — Z8739 Personal history of other diseases of the musculoskeletal system and connective tissue: Secondary | ICD-10-CM | POA: Diagnosis not present

## 2023-08-27 DIAGNOSIS — M85832 Other specified disorders of bone density and structure, left forearm: Secondary | ICD-10-CM

## 2023-08-27 DIAGNOSIS — R748 Abnormal levels of other serum enzymes: Secondary | ICD-10-CM

## 2023-08-27 DIAGNOSIS — R7401 Elevation of levels of liver transaminase levels: Secondary | ICD-10-CM

## 2023-08-27 DIAGNOSIS — Z7689 Persons encountering health services in other specified circumstances: Secondary | ICD-10-CM

## 2023-08-27 DIAGNOSIS — R0602 Shortness of breath: Secondary | ICD-10-CM

## 2023-08-27 DIAGNOSIS — Z853 Personal history of malignant neoplasm of breast: Secondary | ICD-10-CM

## 2023-08-27 NOTE — Progress Notes (Unsigned)
 New patient visit   Patient: Ashley Lester   DOB: [birth year]   68 y.o. Female  MRN: [id number] Visit Date: 08/27/2023  Today's healthcare provider: LAURAINE LOISE BUOY, DO   Chief Complaint  Patient presents with   New Patient (Initial Visit)    Patient is present to establish care and reports no concerns. She reports she ha Gyn that completes her physicals early. Last seen by PCP in December. She would like more information in regards to GeneConnect testing.    Subjective    Ashley Lester is a 68 y.o. female who presents today as a new patient to establish care.  HPI HPI     New Patient (Initial Visit)    Additional comments: Patient is present to establish care and reports no concerns. She reports she ha Gyn that completes her physicals early. Last seen by PCP in December. She would like more information in regards to GeneConnect testing.       Last edited by BUOY LAURAINE LOISE, DO on 08/27/2023 10:28 AM.      Ashley Lester is a 68 year old female who presents for a routine follow-up and discussion of genetic testing.  She has a history of breast cancer treated over ten years ago with radiation therapy. She is not currently under oncology surveillance but continues with routine mammograms, the last being in December 2024. She is interested in genetic testing, specifically GeneConnect, to assess her risks for inherited conditions such as high cholesterol and cancers. She has not undergone comprehensive genetic testing in the past, only specific tests related to her breast cancer diagnosis.  She manages her high cholesterol through diet and exercise, including yoga, Pilates, and walking. Her diet is rich in fruits, vegetables, and chicken. Her cholesterol levels were stable as of her last test in June 2024. She is not on any cholesterol medications, opting instead for red yeast rice and omega-3 supplements.  She has chronic kidney disease, stage 3A, and is  advised to maintain hydration. She has experienced elevated liver enzymes in the past, which fluctuate, and she monitors these levels regularly.  She experiences occasional shortness of breath, particularly after activities like climbing stairs, but not during exercise. No history of asthma or wheezing. Her blood pressure is usually well-controlled, though it was slightly elevated today.  She has received the pneumonia vaccine and the COVID booster in December 2024.    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)    diagnosed on 11-22-10; right breast cancer, invasive mammary cancer, 1.6 cm, Sentinel lymph node negative, ER/PR positive, HER-2/neu is not amplified, Oncotype Dx assay recurrent score 11 (7% chance of distant recurrence at 10 years)   Personal history of malignant neoplasm of breast    treated with right breast wide excision with sentinel node biopsy, mastoplasty, and radiation therapy   Personal history of radiation therapy 2012   right breast ca   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HCC)    Seizure disorder (HCC) 1964   Special screening for malignant neoplasms,  colon    Past Surgical History:  Procedure Laterality Date   BREAST BIOPSY Right 2012   [facility]   BREAST LUMPECTOMY Right 2012    right breast cancer, invasive mammary cancer,   BREAST SURGERY Right 2012   right breast wide excision and sentinel node biopsy completed on 11-21-10 identified a 1.6 cm histologic grade 2, ER/PR positive, HER-2/neu not overexpressing carcinoma with clear margins.  Sentinel nodes x2 were negative for metastatic disease. Low Oncotype DX at 7%.   CHOLECYSTECTOMY  2005   COLONOSCOPY  06/01/2008   Gladis Mariner, M.D. Normal exam.   COLONOSCOPY WITH PROPOFOL  N/A 05/18/2019   Procedure: COLONOSCOPY WITH PROPOFOL ;  Surgeon: Toledo, Ladell POUR, MD;  Location: [facility] ENDOSCOPY;  Service: Gastroenterology;  Laterality: N/A;   GANGLION CYST EXCISION  1980's   radiation therapy Right    breast cancer   WISDOM TOOTH EXTRACTION  1980's   Family Status  Relation Name Status   Mother Foy Alive   Father Zachary Deceased   Sister  Deceased   Sister Engineer, manufacturing systems  (Not Specified)   Other  (Not Specified)  No partnership data on file   Family History  Problem Relation Age of Onset   Cancer Mother    Alcohol abuse Father        also, Wegner's Disease   Leukemia Sister 93   Cancer Sister    Breast cancer Maternal Aunt    Testicular cancer Other        nephew, age late 67's   Social History   Socioeconomic History   Marital status: Married    Spouse name: Not on file   Number of children: Not on file   Years of education: Not on file   Highest education level: Associate degree: academic program  Occupational History   Not on file  Tobacco Use   Smoking status: Never   Smokeless tobacco: Never  Substance and Sexual Activity   Alcohol use: No   Drug use: No   Sexual activity: Yes    Birth control/protection: None  Other Topics Concern   Not on file  Social History Narrative   Not on file   Social Drivers of Health   Financial Resource Strain: Low Risk  (08/20/2023)   Overall Financial Resource Strain (CARDIA)    Difficulty of Paying Living Expenses: Not hard at all  Food Insecurity: No Food Insecurity (08/20/2023)   Hunger Vital Sign    Worried About Running Out of Food in the Last Year: Never true    Ran Out of Food in the Last Year: Never true  Transportation Needs: No Transportation Needs (08/20/2023)   PRAPARE - Therapist, art (Medical): No    Lack of Transportation (Non-Medical): No  Physical Activity: Sufficiently Active (08/20/2023)   Exercise Vital Sign    Days of Exercise per Week: 5 days    Minutes of Exercise per Session: 40 min  Stress: No Stress Concern Present (08/20/2023)   [employer] of Occupational Health - Occupational Stress Questionnaire    Feeling of Stress: Not at all  Social Connections: Moderately Isolated (08/20/2023)   Social Connection and Isolation Panel    Frequency of Communication with Friends and Family: More than three times a week    Frequency of Social Gatherings with Friends and Family: More than three times a week    Attends Religious Services: Never    Database administrator or Organizations: No  Attends Banker Meetings: Not on file    Marital Status: Married   Outpatient Medications Prior to Visit  Medication Sig   Calcium Carb-Cholecalciferol (CALCIUM 1000 + D PO) Take 1 tablet 3 (three) times daily by mouth.    Inulin-Calcium-Vitamin D  (FIBERCHOICE PLUS CALCIUM PO) Take 1 tablet by mouth daily.    Multiple Vitamins-Minerals (PRESERVISION AREDS 2 PO) Take by mouth.   Nutritional Supplements (JUICE PLUS FIBRE PO) Take by mouth.   Omega-3 Fatty Acids (FISH OIL) 1000 MG CAPS Take 3 (three) times daily by mouth.    PREVIDENT 5000 SENSITIVE 1.1-5 % PSTE USE AS DIRECTED 1 TIME DAILY   Red Yeast Rice Extract (RED YEAST RICE PO) Take by mouth 2 (two) times daily.   [DISCONTINUED] calcium acetate (PHOSLO) 667 MG capsule Take 667 mg by mouth 3 (three) times daily with meals.   No facility-administered medications prior to visit.   No Known Allergies  Immunization History  Administered Date(s) Administered   Influenza Split 11/16/2014   Influenza, High Dose Seasonal PF 11/30/2022   Influenza,inj,Quad PF,6+ Mos 10/30/2018, 12/16/2019   Influenza,inj,Quad PF,6-35 Mos 11/23/2021   Influenza-Unspecified 11/25/2007, 11/13/2011,  11/20/2016, 11/12/2017, 12/01/2020   PFIZER Comirnaty(Gray Top)Covid-19 Tri-Sucrose Vaccine 04/07/2019, 04/18/2019   PFIZER(Purple Top)SARS-COV-2 Vaccination 04/07/2019, 04/28/2019, 01/20/2020   PNEUMOCOCCAL CONJUGATE-20 08/27/2023   Pfizer Covid-19 Vaccine Bivalent Booster 39yrs & up 10/28/2020   Pfizer(Comirnaty)Fall Seasonal Vaccine 12 years and older 12/29/2021, 01/21/2023   Pneumococcal Polysaccharide-23 01/16/2021   Td 07/04/2009   Tdap 08/30/2020   Zoster Recombinant(Shingrix) 08/14/2018, 08/15/2018, 11/22/2018    Health Maintenance  Topic Date Due   Hepatitis C Screening  Never done   COVID-19 Vaccine (9 - Pfizer risk 2024-25 season) 10/30/2023 (Originally 07/22/2023)   INFLUENZA VACCINE  08/30/2023   Medicare Annual Wellness (AWV)  01/09/2024   MAMMOGRAM  01/06/2025   Colonoscopy  05/17/2029   DTaP/Tdap/Td (3 - Td or Tdap) 08/31/2030   Pneumococcal Vaccine: 50+ Years  Completed   DEXA SCAN  Completed   Zoster Vaccines- Shingrix  Completed   Hepatitis B Vaccines  Aged Out   HPV VACCINES  Aged Out   Meningococcal B Vaccine  Aged Out    Patient Care Team: Aariana Shankland, Lauraine SAILOR, DO as PCP - General (Family Medicine) Dessa Reyes ORN, MD (General Surgery)  Review of Systems  Constitutional:  Negative for appetite change, chills, fatigue and fever.  Respiratory:  Positive for shortness of breath (increaed shortness of breath; intermittent). Negative for chest tightness.   Cardiovascular:  Negative for chest pain and palpitations.  Gastrointestinal:  Negative for abdominal pain, nausea and vomiting.  Neurological:  Negative for dizziness and weakness.        Objective    BP 122/75 (BP Location: Left Arm, Patient Position: Sitting, Cuff Size: Normal)   Pulse (!) 58   Ht 5' 4 (1.626 m)   Wt 166 lb 3.2 oz (75.4 kg)   SpO2 100%   BMI 28.53 kg/m     Physical Exam Constitutional:      Appearance: Normal appearance.  HENT:     Head: Normocephalic and atraumatic.   Eyes:     General: No scleral icterus.    Extraocular Movements: Extraocular movements intact.     Conjunctiva/sclera: Conjunctivae normal.  Cardiovascular:     Rate and Rhythm: Normal rate and regular rhythm.     Pulses: Normal pulses.     Heart sounds: Normal heart sounds.  Pulmonary:     Effort: Pulmonary effort  is normal. No respiratory distress.     Breath sounds: Normal breath sounds.  Musculoskeletal:     Right lower leg: No edema.     Left lower leg: No edema.  Skin:    General: Skin is warm and dry.  Neurological:     Mental Status: She is alert and oriented to person, place, and time. Mental status is at baseline.  Psychiatric:        Mood and Affect: Mood normal.        Behavior: Behavior normal.     Depression Screen    09/22/2015    9:08 AM 09/16/2014    9:50 AM  PHQ 2/9 Scores  PHQ - 2 Score 0 0   Results for orders placed or performed in visit on 08/27/23  Microalbumin / creatinine urine ratio  Result Value Ref Range   Creatinine, Urine 15.0 Not Estab. mg/dL   Microalbumin, Urine <6.9 Not Estab. ug/mL   Microalb/Creat Ratio <20 0 - 29 mg/g creat  Comprehensive metabolic panel with GFR  Result Value Ref Range   Glucose 83 70 - 99 mg/dL   BUN 14 8 - 27 mg/dL   Creatinine, Ser 8.92 (H) 0.57 - 1.00 mg/dL   eGFR 57 (L) >40 fO/fpw/8.26   BUN/Creatinine Ratio 13 12 - 28   Sodium 140 134 - 144 mmol/L   Potassium 4.3 3.5 - 5.2 mmol/L   Chloride 102 96 - 106 mmol/L   CO2 23 20 - 29 mmol/L   Calcium 9.7 8.7 - 10.3 mg/dL   Total Protein 6.5 6.0 - 8.5 g/dL   Albumin 4.3 3.9 - 4.9 g/dL   Globulin, Total 2.2 1.5 - 4.5 g/dL   Bilirubin Total 0.4 0.0 - 1.2 mg/dL   Alkaline Phosphatase 100 44 - 121 IU/L   AST 35 0 - 40 IU/L   ALT 46 (H) 0 - 32 IU/L  Lipid panel  Result Value Ref Range   Cholesterol, Total 200 (H) 100 - 199 mg/dL   Triglycerides 67 0 - 149 mg/dL   HDL 83 >60 mg/dL   VLDL Cholesterol Cal 12 5 - 40 mg/dL   LDL Chol Calc (NIH) 894 (H) 0 - 99  mg/dL   Chol/HDL Ratio 2.4 0.0 - 4.4 ratio  VITAMIN D  25 Hydroxy (Vit-D Deficiency, Fractures)  Result Value Ref Range   Vit D, 25-Hydroxy 49.5 30.0 - 100.0 ng/mL    Assessment & Plan     Mixed hyperlipidemia -     Lipid panel  Establishing care with new doctor, encounter for  Chronic kidney disease, stage 3a (HCC) -     Microalbumin / creatinine urine ratio -     Comprehensive metabolic panel with GFR -     VITAMIN D  25 Hydroxy (Vit-D Deficiency, Fractures)  Osteopenia of left forearm  History of osteoporosis  Transaminitis -     Comprehensive metabolic panel with GFR  Shortness of breath  History of right breast cancer  Elevated alkaline phosphatase level -     Comprehensive metabolic panel with GFR  Encounter for Prevnar pneumococcal vaccination -     Pneumococcal conjugate vaccine 20-valent    Establishing care with a new doctor, encounter for  Mixed Hyperlipidemia Cholesterol levels well-managed with a slight decrease. She is not on prescription medication, opting for red yeast rice and omega-3 supplements. Emphasized weight management and dietary modifications. - Continue red yeast rice and omega-3 supplements. - Encourage weight loss through diet and exercise. - Monitor cholesterol  levels biannually.  Chronic kidney disease stage 3a Chronic kidney disease stage 3a with slightly low kidney function. - Encourage increased water intake. - Monitor cholesterol levels.  Osteopenia with history of osteoporosis Osteopenia with improvement from osteoporosis. She is physically active and takes calcium and vitamin D  supplements. - Continue calcium and vitamin D  supplementation. - Encourage weight-bearing exercises like yoga and Pilates. - Monitor bone density regularly.  Transaminitis Liver enzymes (AST and ALT) slightly elevated in the past but currently well-managed. No significant liver disease. - Monitor liver function tests periodically.  Intermittent  shortness of breath Intermittent shortness of breath not associated with exercise. No history of asthma or wheezing. - Monitor heart rate during episodes of shortness of breath. - Consider pulmonary function test if symptoms worsen.  History of right breast cancer, status post radiation Right breast cancer treated with radiation over ten years ago. No current oncology follow-up, routine mammograms conducted. - Continue routine mammograms.     Return in about 1 year (around 08/26/2024) for CPE, and mAWV with AWV nurse.     I discussed the assessment and treatment plan with the patient  The patient was provided an opportunity to ask questions and all were answered. The patient agreed with the plan and demonstrated an understanding of the instructions.   The patient was advised to call back or seek an in-person evaluation if the symptoms worsen or if the condition fails to improve as anticipated.    LAURAINE LOISE BUOY, DO  [facility] [phone] (phone) [phone] (fax)  [facility]

## 2023-08-27 NOTE — Patient Instructions (Signed)
 Recommended vaccines: one additional pneumonia - Prevnar-20 or Prevnar-21.

## 2023-08-28 ENCOUNTER — Ambulatory Visit: Payer: Self-pay | Admitting: Family Medicine

## 2023-08-28 LAB — COMPREHENSIVE METABOLIC PANEL WITH GFR
ALT: 46 IU/L — ABNORMAL HIGH (ref 0–32)
AST: 35 IU/L (ref 0–40)
Albumin: 4.3 g/dL (ref 3.9–4.9)
Alkaline Phosphatase: 100 IU/L (ref 44–121)
BUN/Creatinine Ratio: 13 (ref 12–28)
BUN: 14 mg/dL (ref 8–27)
Bilirubin Total: 0.4 mg/dL (ref 0.0–1.2)
CO2: 23 mmol/L (ref 20–29)
Calcium: 9.7 mg/dL (ref 8.7–10.3)
Chloride: 102 mmol/L (ref 96–106)
Creatinine, Ser: 1.07 mg/dL — ABNORMAL HIGH (ref 0.57–1.00)
Globulin, Total: 2.2 g/dL (ref 1.5–4.5)
Glucose: 83 mg/dL (ref 70–99)
Potassium: 4.3 mmol/L (ref 3.5–5.2)
Sodium: 140 mmol/L (ref 134–144)
Total Protein: 6.5 g/dL (ref 6.0–8.5)
eGFR: 57 mL/min/1.73 — ABNORMAL LOW (ref >59)

## 2023-08-28 LAB — LIPID PANEL
Chol/HDL Ratio: 2.4 ratio (ref 0.0–4.4)
Cholesterol, Total: 200 mg/dL — ABNORMAL HIGH (ref 100–199)
HDL: 83 mg/dL (ref >39)
LDL Chol Calc (NIH): 105 mg/dL — ABNORMAL HIGH (ref 0–99)
Triglycerides: 67 mg/dL (ref 0–149)
VLDL Cholesterol Cal: 12 mg/dL (ref 5–40)

## 2023-08-28 LAB — VITAMIN D 25 HYDROXY (VIT D DEFICIENCY, FRACTURES): Vit D, 25-Hydroxy: 49.5 ng/mL (ref 30.0–100.0)

## 2023-08-28 LAB — MICROALBUMIN / CREATININE URINE RATIO
Creatinine, Urine: 15 mg/dL
Microalb/Creat Ratio: <20 mg/g{creat} (ref 0–29)
Microalbumin, Urine: <3 ug/mL

## 2023-09-18 ENCOUNTER — Other Ambulatory Visit: Payer: Self-pay | Admitting: Obstetrics and Gynecology

## 2023-09-18 DIAGNOSIS — N644 Mastodynia: Secondary | ICD-10-CM

## 2023-09-23 ENCOUNTER — Ambulatory Visit
Admission: RE | Admit: 2023-09-23 | Discharge: 2023-09-23 | Disposition: A | Discharge status: Home / Self Care | Source: Ambulatory Visit | Attending: Obstetrics and Gynecology | Admitting: Obstetrics and Gynecology

## 2023-09-23 DIAGNOSIS — N644 Mastodynia: Secondary | ICD-10-CM

## 2023-09-25 ENCOUNTER — Other Ambulatory Visit: Payer: Self-pay | Admitting: Obstetrics and Gynecology

## 2023-09-25 DIAGNOSIS — N644 Mastodynia: Secondary | ICD-10-CM

## 2023-09-25 DIAGNOSIS — Z1231 Encounter for screening mammogram for malignant neoplasm of breast: Secondary | ICD-10-CM

## 2023-09-25 DIAGNOSIS — Z853 Personal history of malignant neoplasm of breast: Secondary | ICD-10-CM

## 2023-10-01 ENCOUNTER — Encounter: Payer: Self-pay | Admitting: Obstetrics and Gynecology

## 2023-10-02 ENCOUNTER — Encounter: Payer: Self-pay | Admitting: Obstetrics and Gynecology

## 2023-10-07 ENCOUNTER — Encounter: Payer: Self-pay | Admitting: Obstetrics and Gynecology

## 2023-10-14 ENCOUNTER — Encounter: Payer: Self-pay | Admitting: Obstetrics and Gynecology

## 2024-01-13 ENCOUNTER — Ambulatory Visit
Admission: RE | Admit: 2024-01-13 | Discharge: 2024-01-13 | Disposition: A | Source: Ambulatory Visit | Attending: Obstetrics and Gynecology | Admitting: Obstetrics and Gynecology

## 2024-01-13 DIAGNOSIS — Z1231 Encounter for screening mammogram for malignant neoplasm of breast: Secondary | ICD-10-CM | POA: Diagnosis present

## 2024-08-17 ENCOUNTER — Ambulatory Visit: Admitting: Family Medicine
